# Patient Record
Sex: Female | Born: 1990 | Race: Black or African American | Hispanic: No | Marital: Single | Smoking: Current every day smoker
Health system: Southern US, Community
[De-identification: ages and names within clinical notes are randomized; demographics above are authoritative.]

## PROBLEM LIST (undated history)

## (undated) DIAGNOSIS — F319 Bipolar disorder, unspecified: Secondary | ICD-10-CM

---

## 2010-04-02 ENCOUNTER — Ambulatory Visit: Payer: Self-pay

## 2012-05-30 ENCOUNTER — Emergency Department: Payer: Self-pay | Admitting: Emergency Medicine

## 2012-05-30 LAB — URINALYSIS, COMPLETE
Bilirubin,UR: NEGATIVE
Blood: NEGATIVE
Hyaline Cast: 2
Nitrite: NEGATIVE
RBC,UR: 2 /HPF (ref 0–5)
Specific Gravity: 1.014 (ref 1.003–1.030)
Squamous Epithelial: 63

## 2012-05-30 LAB — PREGNANCY, URINE: Pregnancy Test, Urine: NEGATIVE m[IU]/mL

## 2014-05-29 ENCOUNTER — Observation Stay: Payer: Self-pay

## 2014-08-07 ENCOUNTER — Inpatient Hospital Stay: Payer: Self-pay

## 2014-08-07 LAB — CBC WITH DIFFERENTIAL/PLATELET
BASOS ABS: 0 10*3/uL (ref 0.0–0.1)
Basophil %: 0.3 %
EOS PCT: 0.2 %
Eosinophil #: 0 10*3/uL (ref 0.0–0.7)
HCT: 37.5 % (ref 35.0–47.0)
HGB: 12.2 g/dL (ref 12.0–16.0)
LYMPHS ABS: 1.7 10*3/uL (ref 1.0–3.6)
LYMPHS PCT: 13.4 %
MCH: 30 pg (ref 26.0–34.0)
MCHC: 32.6 g/dL (ref 32.0–36.0)
MCV: 92 fL (ref 80–100)
MONO ABS: 0.6 x10 3/mm (ref 0.2–0.9)
Monocyte %: 5.1 %
NEUTROS ABS: 10.2 10*3/uL — AB (ref 1.4–6.5)
Neutrophil %: 81 %
Platelet: 237 10*3/uL (ref 150–440)
RBC: 4.08 10*6/uL (ref 3.80–5.20)
RDW: 13.9 % (ref 11.5–14.5)
WBC: 12.7 10*3/uL — ABNORMAL HIGH (ref 3.6–11.0)

## 2014-08-07 LAB — GC/CHLAMYDIA PROBE AMP

## 2014-08-08 LAB — HEMATOCRIT: HCT: 36.5 % (ref 35.0–47.0)

## 2014-08-12 LAB — PATHOLOGY REPORT

## 2014-11-17 ENCOUNTER — Emergency Department: Payer: Self-pay | Admitting: Emergency Medicine
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
## 2015-05-03 NOTE — H&P (Signed)
L&D Evaluation:  History Expanded:  HPI 24 yo G1, EGA [redacted] weeks presents with c/o contractions and large gush of fluid. Cervix changed from 3-5 cm. [facility] + Trich with neg TOC 6/8, h/o Marijuana use with neg UDS in March. TDAP given 6/22   Blood Type (Maternal) A positive   Group B Strep Results Maternal (Result >5wks must be treated as unknown) negative   Maternal HIV Negative   Maternal Syphilis Ab Nonreactive   Maternal Varicella Immune   Rubella Results (Maternal) immune   Maternal T-Dap Immune   Presents with contractions, leaking fluid   Patient's Medical History Bipolar   Patient's Surgical History T&A, orthopedic surgery   Medications Pre Natal Vitamins   Allergies NKDA   Social History tobacco  drugs  former marijuana   Exam:  Vital Signs stable   General no apparent distress   Mental Status clear   Abdomen gravid, tender with contractions   Estimated Fetal Weight Average for gestational age   Pelvic no external lesions   FHT normal rate with no decels   Ucx regular   Impression:  Impression active labor   Plan:  Comments Admission for labor   Electronic Signatures: Vella KohlerBrothers, Adriyana Greenbaum K (CNM)  (Signed 16-Aug-15 08:07)  Authored: L&D Evaluation   Last Updated: 16-Aug-15 08:07 by Vella KohlerBrothers, Margarita Bobrowski K (CNM)

## 2016-04-30 ENCOUNTER — Inpatient Hospital Stay
Admission: EM | Admit: 2016-04-30 | Discharge: 2016-05-03 | DRG: 885 | Disposition: A | Discharge status: Home / Self Care | Payer: Medicaid Other | Source: Intra-hospital | Attending: Psychiatry | Admitting: Psychiatry

## 2016-04-30 ENCOUNTER — Emergency Department
Admission: EM | Admit: 2016-04-30 | Discharge: 2016-04-30 | Disposition: A | Discharge status: Home / Self Care | Payer: Medicaid Other | Attending: Emergency Medicine | Admitting: Emergency Medicine

## 2016-04-30 ENCOUNTER — Encounter: Payer: Self-pay | Admitting: Emergency Medicine

## 2016-04-30 DIAGNOSIS — R45851 Suicidal ideations: Secondary | ICD-10-CM | POA: Diagnosis present

## 2016-04-30 DIAGNOSIS — F331 Major depressive disorder, recurrent, moderate: Principal | ICD-10-CM | POA: Diagnosis present

## 2016-04-30 DIAGNOSIS — R45850 Homicidal ideations: Secondary | ICD-10-CM | POA: Diagnosis present

## 2016-04-30 DIAGNOSIS — F411 Generalized anxiety disorder: Secondary | ICD-10-CM | POA: Diagnosis present

## 2016-04-30 DIAGNOSIS — F1220 Cannabis dependence, uncomplicated: Secondary | ICD-10-CM

## 2016-04-30 DIAGNOSIS — F3163 Bipolar disorder, current episode mixed, severe, without psychotic features: Secondary | ICD-10-CM | POA: Diagnosis not present

## 2016-04-30 DIAGNOSIS — G4700 Insomnia, unspecified: Secondary | ICD-10-CM | POA: Diagnosis present

## 2016-04-30 DIAGNOSIS — F3162 Bipolar disorder, current episode mixed, moderate: Secondary | ICD-10-CM | POA: Diagnosis not present

## 2016-04-30 DIAGNOSIS — F17210 Nicotine dependence, cigarettes, uncomplicated: Secondary | ICD-10-CM | POA: Insufficient documentation

## 2016-04-30 DIAGNOSIS — F17200 Nicotine dependence, unspecified, uncomplicated: Secondary | ICD-10-CM

## 2016-04-30 HISTORY — DX: Bipolar disorder, unspecified: F31.9

## 2016-04-30 LAB — CBC WITH DIFFERENTIAL/PLATELET
Basophils Absolute: 0 10*3/uL (ref 0–0.1)
Basophils Relative: 0 %
Eosinophils Absolute: 0.1 10*3/uL (ref 0–0.7)
HCT: 42 % (ref 35.0–47.0)
HEMOGLOBIN: 14.2 g/dL (ref 12.0–16.0)
Lymphs Abs: 4.5 10*3/uL — ABNORMAL HIGH (ref 1.0–3.6)
MCH: 31.3 pg (ref 26.0–34.0)
MCHC: 33.8 g/dL (ref 32.0–36.0)
MCV: 92.8 fL (ref 80.0–100.0)
Monocytes Absolute: 0.7 10*3/uL (ref 0.2–0.9)
Neutro Abs: 7.1 10*3/uL — ABNORMAL HIGH (ref 1.4–6.5)
Neutrophils Relative %: 57 %
PLATELETS: 225 10*3/uL (ref 150–440)
RBC: 4.53 MIL/uL (ref 3.80–5.20)
RDW: 14 % (ref 11.5–14.5)
WBC: 12.4 10*3/uL — AB (ref 3.6–11.0)

## 2016-04-30 LAB — COMPREHENSIVE METABOLIC PANEL
ALBUMIN: 5 g/dL (ref 3.5–5.0)
ALK PHOS: 54 U/L (ref 38–126)
ALT: 33 U/L (ref 14–54)
AST: 24 U/L (ref 15–41)
Anion gap: 10 (ref 5–15)
BUN: 15 mg/dL (ref 6–20)
CHLORIDE: 105 mmol/L (ref 101–111)
CO2: 23 mmol/L (ref 22–32)
CREATININE: 0.82 mg/dL (ref 0.44–1.00)
Calcium: 9.6 mg/dL (ref 8.9–10.3)
GFR calc non Af Amer: >60 mL/min (ref >60)
GLUCOSE: 102 mg/dL — AB (ref 65–99)
Potassium: 4.1 mmol/L (ref 3.5–5.1)
SODIUM: 138 mmol/L (ref 135–145)
Total Bilirubin: 0.6 mg/dL (ref 0.3–1.2)
Total Protein: 8.4 g/dL — ABNORMAL HIGH (ref 6.5–8.1)

## 2016-04-30 LAB — URINE DRUG SCREEN, QUALITATIVE (ARMC ONLY)
Amphetamines, Ur Screen: NOT DETECTED
Barbiturates, Ur Screen: NOT DETECTED
Benzodiazepine, Ur Scrn: NOT DETECTED
CANNABINOID 50 NG, UR ~~LOC~~: POSITIVE — AB
COCAINE METABOLITE, UR ~~LOC~~: NOT DETECTED
MDMA (ECSTASY) UR SCREEN: NOT DETECTED
Methadone Scn, Ur: NOT DETECTED
Opiate, Ur Screen: NOT DETECTED
PHENCYCLIDINE (PCP) UR S: NOT DETECTED
Tricyclic, Ur Screen: NOT DETECTED

## 2016-04-30 LAB — ETHANOL: Alcohol, Ethyl (B): <5 mg/dL (ref <5)

## 2016-04-30 MED ORDER — HALOPERIDOL 5 MG PO TABS
5.0000 mg | ORAL_TABLET | Freq: Once | ORAL | Status: AC
  Administered 2016-04-30: 5 mg via ORAL

## 2016-04-30 MED ORDER — HALOPERIDOL 5 MG PO TABS
ORAL_TABLET | ORAL | Status: AC
  Administered 2016-04-30: 5 mg via ORAL
  Filled 2016-04-30: qty 1

## 2016-04-30 MED ORDER — HALOPERIDOL LACTATE 5 MG/ML IJ SOLN: 5.0000 mg | Freq: Once | INTRAMUSCULAR | Status: DC

## 2016-04-30 MED ORDER — LORAZEPAM 2 MG PO TABS
2.0000 mg | ORAL_TABLET | Freq: Once | ORAL | Status: AC
  Administered 2016-04-30: 2 mg via ORAL

## 2016-04-30 MED ORDER — LORAZEPAM 2 MG PO TABS: 2.0000 mg | ORAL_TABLET | ORAL | Status: DC | PRN

## 2016-04-30 MED ORDER — NICOTINE 21 MG/24HR TD PT24
21.0000 mg | MEDICATED_PATCH | Freq: Once | TRANSDERMAL | Status: DC
  Administered 2016-04-30: 21 mg via TRANSDERMAL
  Filled 2016-04-30: qty 1

## 2016-04-30 MED ORDER — LORAZEPAM 1 MG PO TABS
1.0000 mg | ORAL_TABLET | Freq: Once | ORAL | Status: AC
  Administered 2016-04-30: 1 mg via ORAL
  Filled 2016-04-30: qty 1

## 2016-04-30 MED ORDER — LORAZEPAM 2 MG PO TABS
ORAL_TABLET | ORAL | Status: AC
  Administered 2016-04-30: 2 mg via ORAL
  Filled 2016-04-30: qty 1

## 2016-04-30 MED ORDER — DIPHENHYDRAMINE HCL 50 MG/ML IJ SOLN: 50.0000 mg | Freq: Once | INTRAMUSCULAR | Status: DC

## 2016-04-30 MED ORDER — DIPHENHYDRAMINE HCL 25 MG PO CAPS
ORAL_CAPSULE | ORAL | Status: AC
  Administered 2016-04-30: 50 mg via ORAL
  Filled 2016-04-30: qty 2

## 2016-04-30 MED ORDER — LORAZEPAM 2 MG/ML IJ SOLN: 2.0000 mg | Freq: Once | INTRAMUSCULAR | Status: DC

## 2016-04-30 MED ORDER — DIPHENHYDRAMINE HCL 25 MG PO CAPS
50.0000 mg | ORAL_CAPSULE | Freq: Once | ORAL | Status: AC
  Administered 2016-04-30: 50 mg via ORAL

## 2016-04-30 NOTE — ED Notes (Signed)

## 2016-04-30 NOTE — ED Notes (Signed)
She remains in hallway bed - pt is anxious and stating  "this is bullshit - I am just gonna walk off and leave."  Pt educated about IVC and what that means   [facility]

## 2016-04-30 NOTE — ED Notes (Signed)
IVC/Consult pending/ legal papers on chart

## 2016-04-30 NOTE — ED Notes (Signed)
BEHAVIORAL HEALTH ROUNDING Patient sleeping: No. Patient alert and oriented: yes Behavior appropriate: Yes.  ; If no, describe:  Nutrition and fluids offered: yes Toileting and hygiene offered: Yes  Sitter present: q15 minute observations and security  monitoring Law enforcement present: Yes  ODS  

## 2016-04-30 NOTE — ED Provider Notes (Signed)
[facility] Emergency Department Provider Note  ____________________________________________  Time seen: 4:30 PM  I have reviewed the triage vital signs and the nursing notes.   HISTORY  Chief Complaint Medical Clearance    HPI [name] is a 25 y.o. female brought to the ED under involuntary commitment petition due to stating 2R a crisis counselor that she was feeling suicidal and also had homicidal thoughts toward her child. She has a history of bipolar disorder and is been off medications for the past 2 months because she didn't like the way it made her feel. On reviewing electronic medical record I see that she's been followed up at [facility] family medicine although she did complain of a psychiatry referral in January she missed a follow-up appointment in February. They voiced significant concerns about her psychiatric stability at that time but just gave her counseling and encouraged her to continue following up.  Clinic note from 02-24-16 [facility] Med  Anxiety Sertraline clearly not effective in managing symptoms however patient did not follow-up 4 weeks post initiation of medication therefore I was not aware of treatment failure. There are many other underlying mental health issues present. I suspect her anxiety is secondary to untreated bipolar disorder, substance use. Will begin Lexapro however she would benefit greatly from seeing psychiatry on an ongoing basis. - escitalopram oxalate (LEXAPRO) 10 MG tablet; Take 1 tablet (10 mg total) by mouth daily.  On Depo-Provera for contraception - medroxyPROGESTERone (DEPO-PROVERA) injection 150 mg; Inject 1 mL (150 mg total) into the muscle Every three (3) months.  History of bipolar disorder In the course of this visit she displays mood swings, impulsivity, psychomotor and poor coping skills. Advised her that I do not have the expertise to fully manage her mental health disorders. I strongly encouraged her to call  and reschedule with psychiatry. I am extremely concerned about her ability to parent and her impaired cognitive thought process. She was given numbers for [employer] [facility] and [employer]. While I do not feel she is an immediate threat to herself or others I do feel she is unstable and not receiving adequate treatment. Psychiatry expressed possible personality disorder and I am concerned the paranoia. She will need to get back in with psychiatry. I will monitor her closely and see her back in 4 weeks. At her return visit we will reassess where she is with resuming care with psychiatry. If she does not follow through I will look into services through the local health department for family home visiting services for parenting information and assistance.   Barriers to goals identified and addressed. Pertinent handouts were given today and reviewed with the patient as indicated. The Care Plan and Self-Management goals have been included on the AVS and the AVS has been printed. I encouraged the patient to keep regular logs for me to review at their next visit. Any outside resources or referrals needed at this time are noted above. Patient's current medications have been reviewed. Patient voiced understanding and all questions have been answered to satisfaction.       Past Medical History  Diagnosis Date  . Bipolar 1 disorder (HCC)      There are no active problems to display for this patient.    History reviewed. No pertinent past surgical history. None  No current outpatient prescriptions on file. None  Allergies Review of patient's allergies indicates no known allergies.   No family history on file.  Social History Social History  Substance Use Topics  . Smoking status: Current Every Day Smoker -- .3 years    Types: Cigarettes  . Smokeless tobacco: None  . Alcohol Use: Yes    Review of Systems  Constitutional:   No fever  or chills.  Eyes:   No vision changes.  ENT:   No sore throat. No rhinorrhea. Cardiovascular:   No chest pain. Respiratory:   No dyspnea or cough. Gastrointestinal:   Negative for abdominal pain, vomiting and diarrhea.  Genitourinary:   Negative for dysuria or difficulty urinating. Musculoskeletal:   Negative for focal pain or swelling Neurological:   Negative for headaches 10-point ROS otherwise negative.  ____________________________________________   PHYSICAL EXAM:  VITAL SIGNS: ED Triage Vitals  Enc Vitals Group     BP 04/30/16 1522 150/95 mmHg     Pulse Rate 04/30/16 1522 103     Resp 04/30/16 1522 20     Temp 04/30/16 1522 98.7 F (37.1 C)     Temp Source 04/30/16 1522 Oral     SpO2 04/30/16 1522 100 %     Weight 04/30/16 1522 154 lb (69.854 kg)     Height 04/30/16 1522 5\' 6"  (1.676 m)     Head Cir --      Peak Flow --      Pain Score --      Pain Loc --      Pain Edu? --      Excl. in GC? --     Vital signs reviewed, nursing assessments reviewed.   Constitutional:   Alert and oriented. Well appearing and in no distress.Upset and tearful Eyes:   No scleral icterus. No conjunctival pallor. PERRL. EOMI.  No nystagmus. ENT   Head:   Normocephalic and atraumatic.   Nose:   No congestion/rhinnorhea. No septal hematoma   Mouth/Throat:   MMM, no pharyngeal erythema. No peritonsillar mass.    Neck:   No stridor. No SubQ emphysema. No meningismus. Hematological/Lymphatic/Immunilogical:   No cervical lymphadenopathy. Cardiovascular:   RRR. Symmetric bilateral radial and DP pulses.  No murmurs.  Respiratory:   Normal respiratory effort without tachypnea nor retractions. Breath sounds are clear and equal bilaterally. No wheezes/rales/rhonchi. Gastrointestinal:   Soft and nontender. Non distended. There is no CVA tenderness.  No rebound, rigidity, or guarding. Genitourinary:   deferred Musculoskeletal:   Nontender with normal range of motion in all extremities.  No joint effusions.  No lower extremity tenderness.  No edema. Neurologic:   Normal speech and language.  CN 2-10 normal. Motor grossly intact. No gross focal neurologic deficits are appreciated.  Skin:    Skin is warm, dry and intact. No rash noted.  No petechiae, purpura, or bullae. No apparent self injury  ____________________________________________    LABS (pertinent positives/negatives) (all labs ordered are listed, but only abnormal results are displayed) Labs Reviewed  COMPREHENSIVE METABOLIC PANEL - Abnormal; Notable for the following:    Glucose, Bld 102 (*)    Total Protein 8.4 (*)    All other components within normal limits  CBC WITH DIFFERENTIAL/PLATELET - Abnormal; Notable for the following:    WBC 12.4 (*)    Neutro Abs 7.1 (*)    Lymphs Abs 4.5 (*)    All other components within normal limits  URINE DRUG SCREEN, QUALITATIVE ([facility] ONLY) - Abnormal; Notable for the following:    Cannabinoid 50 Ng, Ur [place] POSITIVE (*)    All other components within normal limits  ETHANOL  ____________________________________________   EKG    ____________________________________________    RADIOLOGY    ____________________________________________   PROCEDURES   ____________________________________________   INITIAL IMPRESSION / ASSESSMENT AND PLAN / ED COURSE  Pertinent labs & imaging results that were available during my care of the patient were reviewed by me and considered in my medical decision making (see chart for details).  Patient well-appearing no acute distress. Mildly tachycardic at triage which I think is due to her anxiety of being emotionally upset. On my exam her heart rate is about 80. She is medically stable. She is under involuntary commitment petition from [facility]. On reviewing their notes, she does have an extensive complicated history with bipolar disorder and has had severe symptoms recently. In the electronic medical record I also see that she is  followed up with [facility] family medicine and has had issues with compliance with medication and follow-up with psychiatry despite frequently being counseled and encouraged to go. At this time she seems high risk for impulsive or dangerous behavior and we will maintain the IVC hold pending psychiatry evaluation in the emergency department.     ____________________________________________   FINAL CLINICAL IMPRESSION(S) / ED DIAGNOSES  Final diagnoses:  Suicidal ideation  Bipolar disorder, current episode mixed, moderate (HCC)       Portions of this note were generated with dragon dictation software. Dictation errors may occur despite best attempts at proofreading.   Sharman Cheek, MD 04/30/16 [phone]

## 2016-04-30 NOTE — ED Notes (Signed)
IVC/Consult pending/ legal papers on chart/Moved to [facility]

## 2016-04-30 NOTE — ED Notes (Signed)
Arrives IVC due to SI statement to crisis counselor. Denies intention of harming self. States has not taken anything. States does not have history of self harm.

## 2016-04-30 NOTE — ED Notes (Signed)

## 2016-04-30 NOTE — BH Assessment (Signed)
Assessment Note  [name] is an 25 y.o. female. Ms. Luster arrived to the ED with [facility] polices under IVC. She reports that she went to [facility] in hopes of getting help with her bipolar and disability.  She says that she does not know what happened.  She states that the Psychiatrist from [facility] called the police. She states the police told her the psychiatrist found reason for her to be committed. She reports that she is feeling anxious currently and has been dealing with it for the past few months, but this time it did not seem to go away. She reports that her chest gets tight and her body goes stiff.  She reports that she is currently depressed. She denied having auditory or visual hallucinations.  She denied suicidal or homicidal ideation or intent. She reports that she has been under financial stress, and is currently unemployed.  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  She reports that she has court for unlawful use of a motor vehicle, that is currently under appeal. In addition, she is dealing with a recent break up. She reports that she drinks 2 - 24 oz beers a day.  Diagnosis: Bipolar Disorder  Past Medical History:  Past Medical History  Diagnosis Date  . Bipolar 1 disorder (HCC)     History reviewed. No pertinent past surgical history.  Family History: No family history on file.  Social History:  reports that she has been smoking Cigarettes.  She has smoked for the past .3 years. She does not have any smokeless tobacco history on file. She reports that she drinks alcohol. Her drug history is not on file.  Additional Social History:  Alcohol / Drug Use History of alcohol / drug use?: Yes Substance #1 Name of Substance 1: Alcohol 1 - Age of First Use: 18 1 - Amount (size/oz): 2-24 oz beers 1 - Frequency: daily 1 - Last Use / Amount: 04/29/2016  CIWA: CIWA-Ar BP: 122/79 mmHg Pulse Rate: 75 COWS:    Allergies: No Known Allergies  Home Medications:  (Not in a hospital  admission)  OB/GYN Status:  Patient's last menstrual period was 04/23/2016.  General Assessment Data Location of Assessment: [facility] ED TTS Assessment: In system Is this a Tele or Face-to-Face Assessment?: Face-to-Face Is this an Initial Assessment or a Re-assessment for this encounter?: Initial Assessment Marital status: Single Maiden name: n/a Is patient pregnant?: No Pregnancy Status: No Living Arrangements: Children Can pt return to current living arrangement?: Yes Admission Status: Involuntary Is patient capable of signing voluntary admission?: Yes Referral Source: Psychiatrist Insurance type: Medicaid  Medical Screening Exam [facility] Walk-in ONLY) Medical Exam completed: Yes  Crisis Care Plan Living Arrangements: Children Legal Guardian: Other: (Self) Name of Psychiatrist: Denied -  Name of Therapist: Denied  Education Status Is patient currently in school?: No Current Grade: n/a Highest grade of school patient has completed: 12th Name of school: [employer] person: n/a  Risk to self with the past 6 months Suicidal Ideation: No Has patient been a risk to self within the past 6 months prior to admission? : No Suicidal Intent: No Has patient had any suicidal intent within the past 6 months prior to admission? : No Is patient at risk for suicide?: No Suicidal Plan?: No Has patient had any suicidal plan within the past 6 months prior to admission? : No Access to Means: No What has been your use of drugs/alcohol within the last 12 months?: drinks alcohol daily Previous Attempts/Gestures:  No How many times?: 0 Other Self Harm Risks: denied Triggers for Past Attempts: None known Intentional Self Injurious Behavior: None Family Suicide History: No Recent stressful life event(s): Legal Issues, Financial Problems (Not working. relationship issues) Persecutory voices/beliefs?: No Depression: Yes Depression Symptoms: Tearfulness Substance abuse history and/or treatment  for substance abuse?: No Suicide prevention information given to non-admitted patients: Not applicable  Risk to Others within the past 6 months Homicidal Ideation: No Does patient have any lifetime risk of violence toward others beyond the six months prior to admission? : No Thoughts of Harm to Others: No Current Homicidal Intent: No Current Homicidal Plan: No Access to Homicidal Means: No Identified Victim: None identified History of harm to others?: No Assessment of Violence: None Noted Violent Behavior Description: None in recent past Does patient have access to weapons?: No Criminal Charges Pending?: No Does patient have a court date: No Is patient on probation?: No  Psychosis Hallucinations: None noted Delusions: None noted  Mental Status Report Appearance/Hygiene: In scrubs Eye Contact: Fair Motor Activity: Unremarkable Speech: Slow, Soft Level of Consciousness: Quiet/awake Mood: Depressed Affect: Flat Anxiety Level: Minimal Thought Processes: Coherent Judgement: Unimpaired Orientation: Person, Place, Time, Situation Obsessive Compulsive Thoughts/Behaviors: None  Cognitive Functioning Concentration: Normal Memory: Recent Intact IQ: Average Insight: Fair Impulse Control: Fair Appetite: Poor Sleep: No Change Vegetative Symptoms: Staying in bed  ADLScreening [facility] Assessment Services) Patient's cognitive ability adequate to safely complete daily activities?: Yes Patient able to express need for assistance with ADLs?: Yes Independently performs ADLs?: Yes (appropriate for developmental age)  Prior Inpatient Therapy Prior Inpatient Therapy: No Prior Therapy Dates: n/a Prior Therapy Facilty/Provider(s): n/a Reason for Treatment: n/a  Prior Outpatient Therapy Prior Outpatient Therapy: Yes Prior Therapy Dates: 2016 Prior Therapy Facilty/Provider(s): [facility] Reason for Treatment: Bipolar Disorder Does patient have an ACCT team?: No Does  patient have Intensive In-House Services?  : No Does patient have [facility] services? : No Does patient have P4CC services?: No  ADL Screening (condition at time of admission) Patient's cognitive ability adequate to safely complete daily activities?: Yes Patient able to express need for assistance with ADLs?: Yes Independently performs ADLs?: Yes (appropriate for developmental age)       Abuse/Neglect Assessment (Assessment to be complete while patient is alone) Physical Abuse: Denies Verbal Abuse: Denies Sexual Abuse: Denies Exploitation of patient/patient's resources: Denies Self-Neglect: Denies Values / Beliefs Cultural Requests During Hospitalization: None   Advance Directives (For Healthcare) Does patient have an advance directive?: No Would patient like information on creating an advanced directive?: No - patient declined information    Additional Information 1:1 In Past 12 Months?: No CIRT Risk: No Elopement Risk: No Does patient have medical clearance?: Yes     Disposition:  Disposition Initial Assessment Completed for this Encounter: Yes Disposition of Patient: Inpatient treatment program Type of inpatient treatment program: Adult  On Site Evaluation by:   Reviewed with Physician:    Justice DeedsKeisha Soua Lenk 04/30/2016 8:46 PM
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
## 2016-05-01 DIAGNOSIS — F331 Major depressive disorder, recurrent, moderate: Secondary | ICD-10-CM

## 2016-05-01 DIAGNOSIS — F17200 Nicotine dependence, unspecified, uncomplicated: Secondary | ICD-10-CM

## 2016-05-01 DIAGNOSIS — F1220 Cannabis dependence, uncomplicated: Secondary | ICD-10-CM

## 2016-05-01 LAB — URINALYSIS COMPLETE WITH MICROSCOPIC (ARMC ONLY)
Bilirubin Urine: NEGATIVE
Glucose, UA: NEGATIVE mg/dL
Hgb urine dipstick: NEGATIVE
KETONES UR: NEGATIVE mg/dL
Leukocytes, UA: NEGATIVE
Nitrite: NEGATIVE
PH: 6 (ref 5.0–8.0)
PROTEIN: NEGATIVE mg/dL
Specific Gravity, Urine: 1.018 (ref 1.005–1.030)

## 2016-05-01 LAB — TSH: TSH: 2.14 u[IU]/mL (ref 0.350–4.500)

## 2016-05-01 LAB — HEMOGLOBIN A1C: Hgb A1c MFr Bld: 5.5 % (ref 4.0–6.0)

## 2016-05-01 LAB — LIPID PANEL
CHOL/HDL RATIO: 2.8 ratio
CHOLESTEROL: 184 mg/dL (ref 0–200)
HDL: 65 mg/dL (ref >40)
LDL Cholesterol: 109 mg/dL — ABNORMAL HIGH (ref 0–99)
TRIGLYCERIDES: 51 mg/dL (ref <150)
VLDL: 10 mg/dL (ref 0–40)

## 2016-05-01 LAB — PREGNANCY, URINE: PREG TEST UR: NEGATIVE

## 2016-05-01 MED ORDER — WHITE PETROLATUM GEL
Status: DC | PRN
  Filled 2016-05-01: qty 5

## 2016-05-01 MED ORDER — MAGNESIUM HYDROXIDE 400 MG/5ML PO SUSP: 30.0000 mL | Freq: Every day | ORAL | Status: DC | PRN

## 2016-05-01 MED ORDER — ACETAMINOPHEN 325 MG PO TABS: 650.0000 mg | ORAL_TABLET | Freq: Four times a day (QID) | ORAL | Status: DC | PRN

## 2016-05-01 MED ORDER — FLUOXETINE HCL 10 MG PO CAPS
10.0000 mg | ORAL_CAPSULE | Freq: Every day | ORAL | Status: DC
  Administered 2016-05-01 – 2016-05-03 (×3): 10 mg via ORAL
  Filled 2016-05-01 (×3): qty 1

## 2016-05-01 MED ORDER — ALUM & MAG HYDROXIDE-SIMETH 200-200-20 MG/5ML PO SUSP: 30.0000 mL | ORAL | Status: DC | PRN

## 2016-05-01 MED ORDER — HYDROXYZINE HCL 50 MG PO TABS
50.0000 mg | ORAL_TABLET | Freq: Three times a day (TID) | ORAL | Status: DC | PRN
  Administered 2016-05-01 (×2): 50 mg via ORAL
  Filled 2016-05-01 (×2): qty 1

## 2016-05-01 MED ORDER — NICOTINE 21 MG/24HR TD PT24
21.0000 mg | MEDICATED_PATCH | Freq: Every day | TRANSDERMAL | Status: DC
  Administered 2016-05-01 – 2016-05-03 (×3): 21 mg via TRANSDERMAL
  Filled 2016-05-01 (×3): qty 1

## 2016-05-01 MED ORDER — HALOPERIDOL 0.5 MG PO TABS
0.5000 mg | ORAL_TABLET | Freq: Three times a day (TID) | ORAL | Status: DC
  Administered 2016-05-01 – 2016-05-03 (×7): 0.5 mg via ORAL
  Filled 2016-05-01 (×8): qty 1

## 2016-05-01 NOTE — Tx Team (Signed)
Initial Interdisciplinary Treatment Plan   PATIENT STRESSORS: Financial difficulties transportation issues    PATIENT STRENGTHS: General fund of knowledge Physical Health   PROBLEM LIST: Problem List/Patient Goals Date to be addressed Date deferred Reason deferred Estimated date of resolution  anxiety 05/01/16     Depression  05/01/16     'decrease in anxiety' 05/01/16                                          DISCHARGE CRITERIA:  Improved stabilization in mood, thinking, and/or behavior  PRELIMINARY DISCHARGE PLAN: Outpatient therapy  PATIENT/FAMIILY INVOLVEMENT: This treatment plan has been presented to and reviewed with the patient, [name], and/or family member.  The patient and family have been given the opportunity to ask questions and make suggestions.  Hillery JacksChristine W London [place] 05/01/2016, 12:11 AM

## 2016-05-01 NOTE — Progress Notes (Signed)
D: Patient is a 25 y.o.  year-old female admitted to [facility] ambulatory without difficulty. Patient is alert and oriented upon admission. Skin search complete with Baird Lyonsasey RN and no contraband found  A: Admission assessment completed without difficulty. Skin and contraband assessment completed with no skin abnormalities nor contraband found. Q.15 minute safety checks were implemented at the time of admission. Patient was oriented to the unit and escorted to room   20                                                                 R: Patient was receptive to and cooperative with admission assessment. Patient contracts for safety on the unit at this time
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
## 2016-05-01 NOTE — Plan of Care (Signed)
Problem: Diagnosis: Increased Risk For Suicide Attempt Goal: LTG-Patient Will Report Improved Mood and Deny Suicidal LTG (by discharge) Patient will report improved mood and deny suicidal ideation.  Outcome: Progressing Patient is brighter on the unit and denies SI.

## 2016-05-01 NOTE — Progress Notes (Signed)
Recreation Therapy Notes  Date: 05.09.17 Time: 3:00 pm Location: Craft Room  Group Topic: Self-expression  Goal Area(s) Addresses:  Patient will be able to identify a color that represents each emotion. Patient will verbalize benefit of using art as a means of self-expression. Patient will verbalize one emotion experienced while participating in activity.  Behavioral Response: Arrived late, Attentive, Interactive  Intervention: The Colors Within Me  Activity: Patients were given a blank face worksheet and instructed to pick a color for each emotion they were experiencing and to show on the face how much of that emotion they were feeling.  Education: LRT educated patients on other forms of self-expression.  Education Outcome: Acknowledges education/In group clarification offered   Clinical Observations/Feedback: Patient arrived to group at approximately 3:26 pm. LRT explained activity. Patient completed activity by picking a color for each emotion she was experiencing and showing how much of that emotion she was experiencing. Patient contributed to group discussion by sharing what emotions she was feeling, that her emotions are dynamic, how she sees her emotions changing when she is able to d/c, that it was helpful to see her emotions on paper and why, and what emotions she felt while she participated in activity.  Jacquelynn CreeGreene,Amerie Beaumont M, LRT/CTRS 05/01/2016 4:20 PM

## 2016-05-01 NOTE — [facility] Group Notes (Signed)
[facility]   05/01/2016 11:49 AM  Participation Quality:  Patient attended and participated in group discussion introducing herself and sharing that her SMART goal is to "stop getting overwhelmed and be at peace". Patient initially reported her anxiety and depression and lowered then both to 5 once facilitator explained that staff would like to see a 3 or less in their depression and anxiety rating by discharge. Patient was given a daily workbook on Recovery and ensured patient was informed of LOS, rules for milieu, roles of staff, and ensured patient was aware of their psychiatrist and social worker names.   Mood/Affect:  Anxious  Depression Rating:  5  Anxiety Rating:  5  Thoughts of Suicide:  No Will you contract for safety?   NA  Current AVH:  No  Plan for Discharge/Comments:  Discharge home with mom  Transportation Means: mom will pick up  Supports: mom is support and has a child  [name], [name], MSW, LCSW

## 2016-05-01 NOTE — Progress Notes (Signed)
D:  Pt was tearful and anxious earlier in shift, pt states that she does not need to be here and that she wants to leave, pt was upset with the doctor around 10 am because she states that she had not been seen yet, MD notified, MD went to see patient, pt calmed down, pt is demanding at times, denies SI/HI/AVH.  A:  Emotional support provided, Encouraged pt to continue with treatment plan and attend all group activities, q15 min checks maintained for safety.  R:  Pt is not very receptive--minimizing need to be here, going to some groups, med compliant and otherwise pleasant and cooperative with staff and other patients on the unit.

## 2016-05-01 NOTE — [facility] Group Notes (Signed)
[facility] Group Notes:  (Nursing/MHT/Case Management/Adjunct)  Date:  05/01/2016  Time:  2:35 PM  Type of Therapy:  Psychoeducational Skills  Participation Level:  Active  Participation Quality:  Appropriate  Affect:  Appropriate  Cognitive:  Appropriate  Insight:  Appropriate  Engagement in Group:  Engaged and Improving  Modes of Intervention:  Discussion and Education  Summary of Progress/Problems:  Mickey Farberamela M Moses Ellison 05/01/2016, 2:35 PM

## 2016-05-01 NOTE — Progress Notes (Signed)
D: Patient appears brighter on the unit. She denies SI/HI/AVH. She states she's here because of a misunderstanding at [facility]. She denies any pain. She's visible in the milieu.  A: Medication was given with education. Encouragement was provided.  R: Patient was compliant with medication. She has remained calm and cooperative. Safety maintained with 15 min checks,
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
## 2016-05-01 NOTE — [facility] Suicide Risk Assessment (Addendum)
[facility]   Nursing information obtained from:    Demographic factors:    Current Mental Status:    Loss Factors:    Historical Factors:    Risk Reduction Factors:     Total Time spent with patient: 1 hour Principal Problem: SI and depression Diagnosis:   Patient Active Problem List   Diagnosis Date Noted  . Tobacco use disorder [F17.200] 05/01/2016  . Major depressive disorder, recurrent episode, moderate (HCC) [F33.1] 05/01/2016  . Cannabis use disorder, severe, dependence (HCC) [F12.20] 05/01/2016   Subjective Data:   Continued Clinical Symptoms:  Alcohol Use Disorder Identification Test Final Score (AUDIT): 3 The "Alcohol Use Disorders Identification Test", Guidelines for Use in Primary Care, Second Edition.  World Science writerHealth Organization [facility](WHO). Score between 0-7:  no or low risk or alcohol related problems. Score between 8-15:  moderate risk of alcohol related problems. Score between 16-19:  high risk of alcohol related problems. Score 20 or above:  warrants further diagnostic evaluation for alcohol dependence and treatment.   CLINICAL FACTORS:   Depression:   Severe Postpartum Depression Previous Psychiatric Diagnoses and Treatments     Psychiatric Specialty Exam: ROS  Blood pressure 111/64, pulse 78, temperature 98 F (36.7 C), temperature source Oral, resp. rate 20, height 5\' 6"  (1.676 m), weight 67.132 kg (148 lb), last menstrual period 04/23/2016, SpO2 100 %.Body mass index is 23.9 kg/(m^2).                                                        COGNITIVE FEATURES THAT CONTRIBUTE TO RISK:  None    SUICIDE RISK:   Moderate:  Frequent suicidal ideation with limited intensity, and duration, some specificity in terms of plans, no associated intent, good self-control, limited dysphoria/symptomatology, some risk factors present, and identifiable protective factors, including available and accessible social  support.  [facility]  I certify that inpatient services furnished can reasonably be expected to improve the patient's condition.   Jimmy FootmanHernandez-Gonzalez,  Coury Grieger, MD 05/01/2016, 2:57 PM

## 2016-05-02 LAB — RAPID HIV SCREEN (HIV 1/2 AB+AG)
HIV 1/2 Antibodies: NONREACTIVE
HIV-1 P24 Antigen - HIV24: NONREACTIVE

## 2016-05-02 LAB — PROLACTIN: Prolactin: 47.3 ng/mL — ABNORMAL HIGH (ref 4.8–23.3)

## 2016-05-02 LAB — CHLAMYDIA/NGC RT PCR (ARMC ONLY)
Chlamydia Tr: NOT DETECTED
N gonorrhoeae: NOT DETECTED

## 2016-05-02 MED ORDER — HALOPERIDOL 0.5 MG PO TABS: 0.5000 mg | ORAL_TABLET | Freq: Three times a day (TID) | ORAL | Status: DC | PRN

## 2016-05-02 MED ORDER — FLUOXETINE HCL 10 MG PO CAPS: 10.0000 mg | ORAL_CAPSULE | Freq: Every day | ORAL | Status: DC

## 2016-05-02 MED ORDER — HYDROXYZINE HCL 50 MG PO TABS
50.0000 mg | ORAL_TABLET | Freq: Every day | ORAL | Status: DC
  Administered 2016-05-02: 50 mg via ORAL
  Filled 2016-05-02: qty 1

## 2016-05-02 NOTE — [facility] Group Notes (Signed)
[facility] LCSW Group Therapy  05/02/2016 2:36 PM  Type of Therapy:  Group Therapy   Summary of Progress/Problems:  Patient attended and participated in group and was attentive during the movie shown "The Happy Movie" which is a documentary about what truly makes people happy and how to measure happiness. Patient was attentive throughout the group session and introduced herself sharing that from the movie she realized that "50% is genetics, 10% is your job or situation when it comes to happiness so I have to take control of the other 40%".   Lulu RidingIngle, Olene Godfrey T, MSW, LCSW 05/02/2016, 2:36 PM

## 2016-05-02 NOTE — Progress Notes (Signed)

## 2016-05-02 NOTE — Plan of Care (Signed)
Problem: Diagnosis: Increased Risk For Suicide Attempt Goal: STG-Patient Will Comply With Medication Regime Outcome: Progressing Patient is compliant with medication regimen currently
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
## 2016-05-02 NOTE — [facility] Group Notes (Signed)
[facility] LCSW Group Therapy  05/02/2016 11:03 AM  Type of Therapy:  Group Therapy  Participation Level:  Active  Participation Quality:  Appropriate and Attentive  Affect:  Appropriate  Cognitive:  Alert, Appropriate and Oriented  Insight:  Engaged  Engagement in Therapy:  Engaged  Modes of Intervention:  Discussion, Socialization and Support  Summary of Progress/Problems: Patient attended and participated in group discussion appropriately introducing herself and sharing during an introductory exercise that her Dream Vacation would be to [place] the [place] and the culture". Patient shared that her life is out of Balance with conflict with family but she wants to get a better job and provide for herself and child. Patient received support from other group members encouraging her to go to school and giving hope that she will find a "GOOD husband".    Lulu RidingIngle, Clayton Jarmon T, MSW, LCSW 05/02/2016, 11:03 AM

## 2016-05-02 NOTE — Tx Team (Signed)
Interdisciplinary Treatment Plan Update (Adult)  Date:  05/02/2016 Time Reviewed:  5:50 PM  Progress in Treatment: Attending groups: Yes. Participating in groups:  Yes. Taking medication as prescribed:  Yes. Tolerating medication:  Yes. Family/Significant othe contact made:  No, will contact:  mother Patient understands diagnosis:  Yes. Discussing patient identified problems/goals with staff:  Yes. Medical problems stabilized or resolved:  Yes. Denies suicidal/homicidal ideation: Yes. Issues/concerns per patient self-inventory:  No. Other:  New problem(s) identified: No, Describe:  'Pt concerns about bills  Discharge Plan or Barriers:  D/C home with Follow up with [facility] CST  Reason for Continuation of Hospitalization: Anxiety Depression  Comments:[name] is a 25 year old female with a history of bipolar depression and complaints of irritability and anxiety. Since beginning her medications (haldol 0.5 mg and Prozac 10 mg) yesterday the patient reports feeling more calm and at ease and is more receptive to the admission. She does report that some other patients in group therapy were causing her to become more anxious and irritable this morning, but otherwise she feels well and has found benefit in the split group therapies. She is interested in one on one counseling. She states that she feels motivated to take care of herself and to eat. She stated she had difficulty staying asleep last night, and found it frustrating. She denies any suicidal or homicidal ideations, or any A/VH. She is still anxious about having to pay her bills that are due at the end of this week as she is unsure of where she will get the money  Estimated length of stay:1 day  New goal(s):  Review of initial/current patient goals per problem list:   1.  Goal(s):Pt will participate in aftercare plan.  Met:  No  Target date:Dsicharge  As evidenced ZO:XWRU for housing and psychiatric follow up being  identified  2.  Goal (s):Pt will exhibit decreased depressive symptoms and suicidal ideation.  Met:  No  Target date:Discharge  As evidenced by:Pt will utilize self-rating of depression at 3 or below and demonstrate decreased signs of depression OR be deemed stable by MD.    Attendees: Patient:  [name] 5/10/20175:50 PM  Family:   5/10/20175:50 PM  Physician:  Hernandez 5/10/20175:50 PM  Nursing:   Polly Cobia, RN 5/10/20175:50 PM  Case Manager:   5/10/20175:50 PM  Counselor:  Dossie Arbour, LCSW 5/10/20175:50 PM  Other:   5/10/20175:50 PM  Other:   5/10/20175:50 PM  Other:   5/10/20175:50 PM  Other:  5/10/20175:50 PM  Other:  5/10/20175:50 PM  Other:  5/10/20175:50 PM  Other:  5/10/20175:50 PM  Other:  5/10/20175:50 PM  Other:  5/10/20175:50 PM  Other:   5/10/20175:50 PM   Scribe for Treatment Team:   August Saucer, 05/02/2016, 5:50 PM, MSW, LCSW

## 2016-05-02 NOTE — Plan of Care (Signed)
Problem: Diagnosis: Increased Risk For Suicide Attempt Goal: LTG-Patient Will Report Improved Mood and Deny Suicidal LTG (by discharge) Patient will report improved mood and deny suicidal ideation.  Outcome: Progressing Patient reports her mood is better today than it was yesterday and patient denies suicidal ideation currently.

## 2016-05-02 NOTE — [facility] Group Notes (Signed)
[facility] Group Notes:  (Nursing/MHT/Case Management/Adjunct)  Date:  05/02/2016  Time:  3:53 PM  Type of Therapy:  Psychoeducational Skills  Participation Level:  Active  Participation Quality:  Appropriate, Attentive, Sharing and Supportive  Affect:  Appropriate  Cognitive:  Appropriate  Insight:  Appropriate  Engagement in Group:  Supportive  Modes of Intervention:  Discussion and Education  Summary of Progress/Problems:  [name] [name] 05/02/2016, 3:53 PM

## 2016-05-02 NOTE — BHH Suicide Risk Assessment (Signed)
[facility]   Principal Problem: Major depressive disorder, recurrent episode, moderate (HCC) Discharge Diagnoses:  Patient Active Problem List   Diagnosis Date Noted  . Tobacco use disorder [F17.200] 05/01/2016  . Major depressive disorder, recurrent episode, moderate (HCC) [F33.1] 05/01/2016  . Cannabis use disorder, severe, dependence (HCC) [F12.20] 05/01/2016     Psychiatric Specialty Exam: ROS  Blood pressure 118/74, pulse 75, temperature 98.5 F (36.9 C), temperature source Oral, resp. rate 20, height 5\' 6"  (1.676 m), weight 67.132 kg (148 lb), last menstrual period 04/23/2016, SpO2 100 %.Body mass index is 23.9 kg/(m^2).                                                       Mental Status Per Nursing Assessment::   On Admission:     Demographic Factors:  Adolescent or young adult, Low socioeconomic status, Living alone and Unemployed  Loss Factors: Financial problems/change in socioeconomic status  Historical Factors: Impulsivity  Risk Reduction Factors:   Responsible for children under 25 years of age, Sense of responsibility to family and Positive social support  Continued Clinical Symptoms:  Depression:   Impulsivity Insomnia Alcohol/Substance Abuse/Dependencies Previous Psychiatric Diagnoses and Treatments  Cognitive Features That Contribute To Risk:  None    Suicide Risk:  Minimal: No identifiable suicidal ideation.  Patients presenting with no risk factors but with morbid ruminations; may be classified as minimal risk based on the severity of the depressive symptoms   Jimmy FootmanHernandez-Gonzalez,  Aryn Kops, MD 05/03/2016, 8:07 AM

## 2016-05-02 NOTE — Progress Notes (Addendum)
[facility]  05/03/2016 1:24 PM [name]  MRN:  [id number] Subjective:  [name] is a 25 year old female with a history of bipolar depression and complaints of irritability and anxiety. Since beginning her medications (haldol 0.5 mg and Prozac 10 mg) yesterday the patient reports feeling more calm and at ease and is more receptive to the admission. She does report that some other patients in group therapy were causing her to become more anxious and irritable this morning, but otherwise she feels well and has found benefit in the split group therapies. She is interested in one on one counseling. She states that she feels motivated to take care of herself and to eat. She stated she had difficulty staying asleep last night, and found it frustrating. She denies any suicidal or homicidal ideations, or any A/VH. She is still anxious about having to pay her bills that are due at the end of this week as she is unsure of where she will get the money.  In treatment team patient discussed her worries and frustrations, and was told about CST. The patient seemed interested in this as a resource once she is discharged and will discuss with SW later  Per Nursing:  D: Patient is alert and oriented on the unit this shift. Patient attended and actively participated in groups today. Patient denies suicidal ideation, homicidal ideation, auditory or visual hallucinations at the present time.  A: Scheduled medications are administered to patient as per MD orders. Emotional support and encouragement are provided. Patient is maintained on q.15 minute safety checks. Patient is informed to notify staff with questions or concerns. R: No adverse medication reactions are noted. Patient is cooperative with medication administration and treatment plan today. Patient is receptive, calm and cooperative on the unit at this time. Patient interacts well with others on the unit this shift. Patient  contracts for safety at this time. Patient remains safe at this time   Principal Problem: Major depressive disorder, recurrent episode, moderate (HCC) Diagnosis:   Patient Active Problem List   Diagnosis Date Noted  . Tobacco use disorder [F17.200] 05/01/2016  . Major depressive disorder, recurrent episode, moderate (HCC) [F33.1] 05/01/2016  . Cannabis use disorder, severe, dependence (HCC) [F12.20] 05/01/2016   Total Time spent with patient: 30 minutes   Past Medical History:  Past Medical History  Diagnosis Date  . Bipolar 1 disorder (HCC)    History reviewed. No pertinent past surgical history. Family History: History reviewed. No pertinent family history.   Social History:  History  Alcohol Use  . Yes     History  Drug Use Not on file    Social History   Social History  . Marital Status: Single    Spouse Name: N/A  . Number of Children: N/A  . Years of Education: N/A   Social History Main Topics  . Smoking status: Current Every Day Smoker -- .3 years    Types: Cigarettes  . Smokeless tobacco: None  . Alcohol Use: Yes  . Drug Use: None  . Sexual Activity: Not Asked   Other Topics Concern  . None   Social History Narrative   Sleep: Poor   Appetite:  Fair  Current Medications: Current Facility-Administered Medications  Medication Dose Route Frequency Provider Last Rate Last Dose  . acetaminophen (TYLENOL) tablet 650 mg  650 mg Oral Q6H PRN Audery AmelJohn T Clapacs, MD      . alum & mag hydroxide-simeth (MAALOX/MYLANTA) 200-200-20 MG/5ML suspension 30 mL  30 mL Oral Q4H PRN Audery Amel, MD      . FLUoxetine (PROZAC) capsule 10 mg  10 mg Oral Daily Jimmy Footman, MD   10 mg at 05/03/16 0815  . haloperidol (HALDOL) tablet 0.5 mg  0.5 mg Oral TID Jimmy Footman, MD   0.5 mg at 05/03/16 0815  . hydrOXYzine (ATARAX/VISTARIL) tablet 50 mg  50 mg Oral TID PRN Jimmy Footman, MD   50 mg at 05/01/16 1653  . hydrOXYzine (ATARAX/VISTARIL)  tablet 50 mg  50 mg Oral QHS Jimmy Footman, MD   50 mg at 05/02/16 2123  . magnesium hydroxide (MILK OF MAGNESIA) suspension 30 mL  30 mL Oral Daily PRN Audery Amel, MD      . nicotine (NICODERM CQ - dosed in mg/24 hours) patch 21 mg  21 mg Transdermal Daily Jimmy Footman, MD   21 mg at 05/03/16 0815  . white petrolatum (VASELINE) gel   Topical PRN Jimmy Footman, MD       Current Outpatient Prescriptions  Medication Sig Dispense Refill  . FLUoxetine (PROZAC) 10 MG capsule Take 1 capsule (10 mg total) by mouth daily. 30 capsule 0  . haloperidol (HALDOL) 0.5 MG tablet Take 1 tablet (0.5 mg total) by mouth every 8 (eight) hours as needed for agitation. 30 tablet 0  . hydrOXYzine (ATARAX/VISTARIL) 50 MG tablet Take 1 tablet (50 mg total) by mouth at bedtime as needed. 30 tablet 0    Lab Results:  Results for orders placed or performed during the hospital encounter of 04/30/16 (from the past 48 hour(s))  Urinalysis complete, with microscopic ([facility] only)     Status: Abnormal   Collection Time: 05/01/16  3:12 PM  Result Value Ref Range   Color, Urine YELLOW (A) YELLOW   APPearance CLEAR (A) CLEAR   Glucose, UA NEGATIVE NEGATIVE mg/dL   Bilirubin Urine NEGATIVE NEGATIVE   Ketones, ur NEGATIVE NEGATIVE mg/dL   Specific Gravity, Urine 1.018 1.005 - 1.030   Hgb urine dipstick NEGATIVE NEGATIVE   pH 6.0 5.0 - 8.0   Protein, ur NEGATIVE NEGATIVE mg/dL   Nitrite NEGATIVE NEGATIVE   Leukocytes, UA NEGATIVE NEGATIVE   RBC / HPF 0-5 0 - 5 RBC/hpf   WBC, UA 0-5 0 - 5 WBC/hpf   Bacteria, UA RARE (A) NONE SEEN   Squamous Epithelial / LPF 0-5 (A) NONE SEEN   Mucous PRESENT   Pregnancy, urine     Status: None   Collection Time: 05/01/16  3:12 PM  Result Value Ref Range   Preg Test, Ur NEGATIVE NEGATIVE  Chlamydia/NGC rt PCR ([facility] only)     Status: None   Collection Time: 05/02/16  5:12 PM  Result Value Ref Range   Specimen source GC/Chlam URINE, RANDOM     Chlamydia Tr NOT DETECTED NOT DETECTED   N gonorrhoeae NOT DETECTED NOT DETECTED    Comment: (NOTE) 100  This methodology has not been evaluated in pregnant women or in 200  patients with a history of hysterectomy. 300 400  This methodology will not be performed on patients less than 21  years of age.     Blood Alcohol level:  Lab Results  Component Value Date   ETH <5 04/30/2016    Musculoskeletal: Strength & Muscle Tone: within normal limits Gait & Station: normal Patient leans: N/A  Psychiatric Specialty Exam: Review of Systems  Constitutional: Negative.   HENT: Negative.   Eyes: Negative.   Respiratory: Negative.   Cardiovascular: Negative.  Gastrointestinal: Negative.   Genitourinary: Negative.   Musculoskeletal: Negative.   Skin: Negative.   Neurological: Negative.   Endo/Heme/Allergies: Negative.   Psychiatric/Behavioral: Positive for depression. Negative for suicidal ideas, hallucinations, memory loss and substance abuse. The patient is nervous/anxious and has insomnia.     Blood pressure 118/74, pulse 75, temperature 98.5 F (36.9 C), temperature source Oral, resp. rate 20, height  (1.676 m), weight 67.132 kg (148 lb), last menstrual period 04/23/2016, SpO2 100 %.Body mass index is 23.9 kg/(m^2).  General Appearance: Well Groomed  Patent attorney::  Good  Speech:  Normal Rate  Volume:  Normal  Mood:  Euthymic  Affect:  Appropriate  Thought Process:  Linear  Orientation:  Full (Time, Place, and Person)  Thought Content:  WDL  Suicidal Thoughts:  No  Homicidal Thoughts:  No  Memory:  Immediate;   Good Recent;   Fair Remote;   Fair  Judgement:  Fair  Insight:  Shallow  Psychomotor Activity:  Normal  Concentration:  Fair  Recall:  [employer] of Knowledge:Fair  Language: Fair  Akathisia:  No  Handed:    AIMS (if indicated):     Assets:  Communication Skills Desire for Improvement  ADL's:  Intact  Cognition: WNL  Sleep:  Number of Hours: 8    Treatment Plan Summary: Daily contact with patient to assess and evaluate symptoms and progress in treatment and Medication management   Depressed Mood: -Continue Prozac 10 mg daily  Agitation: -Continue Haldol 0.5 mg TID  Anxiety:  -Continue Vistaril 50 mg TID PRN   Insomnia: -Start scheduled Vistaril 50 mg QHS  Tobacco use disorder:  -will start a nicotine patch of 21 mg  Labs: -UA--- unremarkable  -Urine pregnancy--- negative   15 minute checks   Full diet   Status: Involuntary commitment   Disposition: Discharge home once stable on medications and follow up with outpatient therapy and resources at [facility].   Will discuss with SW as this case might need to be reported to CPS. Per Dr. Georjean Mode, from [facility], note: "my daughter makes me so angry. I'm scared I will hurt her. She is so undisciplined. Have to fight every day with myself. I can see why people hurt their kids. I feel like taking her life too cause no one would understand her"   [facility] from [facility] family medicine were reviewed.  Collateral info was obtained from mother  Preparing discharge for tomorrow   Jimmy Footman, MD 05/03/2016, 1:24 PM

## 2016-05-03 MED ORDER — HYDROXYZINE HCL 50 MG PO TABS: 50.0000 mg | ORAL_TABLET | Freq: Every evening | ORAL | Status: DC | PRN

## 2016-05-03 NOTE — Progress Notes (Signed)
  [facility] :  Will you be returning to the same living situation after discharge:  Yes,  home  At discharge, do you have transportation home?: Yes,  mother  Do you have the ability to pay for your medications: Yes,  Insurance   Release of information consent forms completed and in the chart;  Patient's signature needed at discharge.  Patient to Follow up at: Follow-up Information    Follow up with [facility] On 05/07/2016.   Why:  You have an appointment with [facility]. [facility] Monday May 15th at 7:00am.    Contact information:   [address] [place] [address] [phone]       Next level of care provider has [facility] Link:no  Safety Planning and Suicide Prevention discussed: Yes,  with patient and mother   Have you used any form of tobacco in the last 30 days? (Cigarettes, Smokeless Tobacco, Cigars, and/or Pipes): Yes  Has patient been referred to the Quitline?: Patient refused referral  Patient has been referred for addiction treatment: Pt. refused referral  Rondall Allegraandace L Rozetta Stumpp MSW, LCSWA  05/03/2016, 11:10 AM

## 2016-05-03 NOTE — Progress Notes (Signed)
Patient denies SI/HI

## 2016-05-03 NOTE — BHH Group Notes (Signed)
BHH Group Notes:  (Nursing/MHT/Case Management/Adjunct)  Date:  05/03/2016  Time:  1:29 AM  Type of Therapy:  Group Therapy  Participation Level:  Active  Participation Quality:  Appropriate  Affect:  Appropriate  Cognitive:  Appropriate  Insight:  Appropriate  Engagement in Group:  Engaged  Modes of Intervention:  n/a  Summary of Progress/Problems:  Veva Holesshley Imani Anavi Branscum 05/03/2016, 1:29 AM

## 2016-05-03 NOTE — BHH Counselor (Signed)
Adult Comprehensive Assessment  Patient ID: [name], female   DOB: 1991/01/17, 25 y.o.   MRN: [id number]  Information Source:   Patient   Current Stressors:  Employment / Job issues: unemployed, finds it difficult to keep work due to feeling distracted. Financial / Lack of resources (include bankruptcy): unemployed since Jan. Social relationships: limited support Substance abuse: Pt denies, but is documented elsewhere in the chart that she drinks alcohol daity  Living/Environment/Situation:  Living Arrangements: Children Living conditions (as described by patient or guardian): good How long has patient lived in current situation?: 3 years What is atmosphere in current home: Comfortable, Other (Comment) (stressful- due to depression)  Family History:  Marital status: Single Are you sexually active?: Yes What is your sexual orientation?: heterosexual Has your sexual activity been affected by drugs, alcohol, medication, or emotional stress?: yes, decreased libido Does patient have children?: Yes How many children?: 1 How is patient's relationship with their children?: 4021 month old [name], [name]  Childhood History:  By whom was/is the patient raised?: Mother Additional childhood history information: and step father, good childhood, no concerns Description of patient's relationship with caregiver when they were a child: good Patient's description of current relationship with people who raised him/her: good, wishes mother was more supportive/understanding of depression How were you disciplined when you got in trouble as a child/adolescent?: called police, sent to room, etc. Does patient have siblings?: Yes Number of Siblings: 2 Description of patient's current relationship with siblings: 1 brother, 1 sister Did patient suffer any verbal/emotional/physical/sexual abuse as a child?: No Did patient suffer from severe childhood neglect?: No Was the patient ever a victim of a  crime or a disaster?: No Witnessed domestic violence?: No Has patient been effected by domestic violence as an adult?: No  Education:  Highest grade of school patient has completed: 12th, some college Currently a student?: No Name of school: [employer] Learning disability?: No What learning problems does patient have?: feels she has difficulty reading and retaining information  Employment/Work Situation:   Employment situation: Unemployed What is the longest time patient has a held a job?: 2 years  Where was the patient employed at that time?: [employer], most recently working at [employer] for 11 months. Has patient ever been in the Eli Lilly and Companymilitary?: No Has patient ever served in combat?: No Did You Receive Any Psychiatric Treatment/Services While in the [employer]?: No Are There Guns or Other Weapons in Your Home?: No Are These Weapons Safely Secured?: No  Financial Resources:   Surveyor, quantityinancial resources: [employer], Food stamps Does patient have a representative payee or guardian?: No  Alcohol/Substance Abuse:   What has been your use of drugs/alcohol within the last 12 months?: Pt denies in assessment but has also been documented in chart that she drinks daily. If attempted suicide, did drugs/alcohol play a role in this?: No Alcohol/Substance Abuse Treatment Hx: Denies past history Has alcohol/substance abuse ever caused legal problems?: No  Social Support System:   Forensic psychologistatient's Community Support System: Poor Describe Community Support System: mother Type of faith/religion: none How does patient's faith help to cope with current illness?: none  Leisure/Recreation:   Leisure and Hobbies: being outside, walking  Strengths/Needs:   What things does the patient do well?: mother In what areas does patient struggle / problems for patient: feeling guilty that she's not able to do all of the "extra" stuff as a mom  Discharge Plan:   Does patient have access to transportation?: No (Mom helps when she can) Will  patient be returning to same living situation after discharge?: Yes Currently receiving community mental health services: No If no, would patient like referral for services when discharged?: Yes (What county?) ([facility] CST referral) Does patient have financial barriers related to discharge medications?: Yes Patient description of barriers related to discharge medications: no income at this time  Summary/Recommendations:   Summary and Recommendations (to be completed by the evaluator): Pt is 25 year old female, brought to ER for depression.  She has not had any outpatient services prior to going to [facility] in Crisis and being referred to [facility] ER.  While on the unit Pt will have the opportunity to participate in groups and therapeutic milieu. She will have medications managed and assistance with discharge planning.  Reccomendations for discharge include outpatient medicaation management and counseling.  Also reccomend referral to [facility] if available   as PT has minimal access to transportation,, is at risk of loosing her apartmentt and difficulty maintaining employment.  Vern Guerette L Jovoni Borkenhagen. MSW, [facility]  05/03/2016

## 2016-05-03 NOTE — Progress Notes (Signed)
Patient ID: [name], female   DOB: [birth year], 25 y.o.   MRN: [id number]  CSW completed CPS report with [facility] [place] Child Prote[facility]. Pt is aware of report.   Daisy FloroCandace L Maranda Marte MSW, LCSWA  05/03/2016 11:20 AM

## 2016-05-03 NOTE — BHH Group Notes (Signed)
BHH LCSW Group Therapy  05/03/2016 10:55 AM  Type of Therapy:  Group Therapy  Participation Level:  Active  Participation Quality:  Appropriate and Attentive  Affect:  Appropriate  Cognitive:  Alert, Appropriate and Oriented  Insight:  Improving  Engagement in Therapy:  Engaged  Modes of Intervention:  Discussion, Socialization and Support  Summary of Progress/Problems: Patient arrived 10 minutes late to group and was not able to participate in introduction exercise but did introduce herself and participated appropriately in group discussion. Patient reports that she listens to music as a coping skill and Emotion Regulation when she struggles with depression and that sometimes she listens to music that makes her cry to get it out and feels better afterward.    Lulu RidingIngle, Davina Howlett T, MSW, LCSW 05/03/2016, 10:55 AM

## 2016-05-03 NOTE — BHH Group Notes (Signed)
[facility]   05/03/2016 10:42 AM  Participation Quality:  Patient was called to group but did not attend.     Lulu RidingIngle, Yani Lal T, MSW, LCSW

## 2016-05-03 NOTE — Plan of Care (Signed)
Problem: Ineffective individual coping Goal: LTG: Patient will report a decrease in negative feelings Outcome: Progressing Patient denies SI/HI.      

## 2016-05-03 NOTE — Progress Notes (Signed)
.  D: Pt denies SI/HI/AVH. Pt is pleasant and cooperative. Pt appears less anxious and she is interacting with peers and staff appropriately.  A: Pt was offered support and encouragement. Pt was given scheduled medications. Pt was encouraged to attend groups. Q 15 minute checks were done for safety.  R:Pt attends groups and interacts well with peers and staff. Pt is taking medication. Pt has no complaints.Pt receptive to treatment and safety maintained on unit.   

## 2016-05-03 NOTE — Plan of Care (Signed)
Problem: Ineffective individual coping Goal: STG: Patient will remain free from self harm Outcome: Progressing Patient denies SI/HI .      

## 2016-05-03 NOTE — Progress Notes (Signed)
D: Pt denies SI/HI/AVH. Pt is pleasant and cooperative, affect flat and sad, denies pain and discomfort. Pt  appears less anxious and she is interacting with peers and staff appropriately.  A: Pt was offered support and encouragement. Pt was given scheduled medications. Pt was encouraged to attend groups. Q 15 minute checks were done for safety.  R: Pt attends  groups and interacts well with peers and staff. Pt is taking medication. Pt has no complaints.Pt receptive to treatment and safety maintained on unit.

## 2016-05-03 NOTE — Progress Notes (Signed)
Patient discharged home. DC instructions provided and explained. Medications reviewed. Rx given. All questions answered. Pt stable at discharge. Denies SI, HI, AVH.  

## 2016-05-03 NOTE — BHH Suicide Risk Assessment (Signed)
BHH INPATIENT:  Family/Significant Other Suicide Prevention Education  Suicide Prevention Education:  Education Completed;[name] (Mother) [phone] has been identified by the patient as the family member/significant other with whom the patient will be residing, and identified as the person(s) who will aid the patient in the event of a mental health crisis (suicidal ideations/suicide attempt).  With written consent from the patient, the family member/significant other has been provided the following suicide prevention education, prior to the and/or following the discharge of the patient.  The suicide prevention education provided includes the following:  Suicide risk factors  Suicide prevention and interventions  National Suicide Hotline telephone number  [facility] assessment telephone number  [facility] Emergency Assistance 911  [facility] and/or Residential Mobile Crisis Unit telephone number  Request made of family/significant other to:  Remove weapons (e.g., guns, rifles, knives), all items previously/currently identified as safety concern.    Remove drugs/medications (over-the-counter, prescriptions, illicit drugs), all items previously/currently identified as a safety concern.  The family member/significant other verbalizes understanding of the suicide prevention education information provided.  The family member/significant other agrees to remove the items of safety concern listed above.  [name] MSW, LCSWA  05/03/2016, 11:02 AM

## 2016-05-03 NOTE — Plan of Care (Signed)
Problem: Diagnosis: Increased Risk For Suicide Attempt Goal: LTG-Patient Will Show Positive Response to Medication LTG (by discharge) : Patient will show positive response to medication and will participate in the development of the discharge plan.  Outcome: Progressing Reports not HI, SI

## 2016-10-04 ENCOUNTER — Emergency Department
Admission: EM | Admit: 2016-10-04 | Discharge: 2016-10-04 | Disposition: A | Discharge status: Home / Self Care | Payer: Medicaid Other | Attending: Emergency Medicine | Admitting: Emergency Medicine

## 2016-10-04 ENCOUNTER — Encounter: Payer: Self-pay | Admitting: Emergency Medicine

## 2016-10-04 DIAGNOSIS — J029 Acute pharyngitis, unspecified: Secondary | ICD-10-CM | POA: Insufficient documentation

## 2016-10-04 DIAGNOSIS — F17210 Nicotine dependence, cigarettes, uncomplicated: Secondary | ICD-10-CM | POA: Insufficient documentation

## 2016-10-04 DIAGNOSIS — Z79899 Other long term (current) drug therapy: Secondary | ICD-10-CM | POA: Insufficient documentation

## 2016-10-04 LAB — POCT RAPID STREP A: STREPTOCOCCUS, GROUP A SCREEN (DIRECT): NEGATIVE

## 2016-10-04 MED ORDER — LIDOCAINE VISCOUS 2 % MT SOLN: 20.0000 mL | OROMUCOSAL | 0 refills | Status: DC | PRN

## 2016-10-04 MED ORDER — AMOXICILLIN 250 MG PO CHEW: 250.0000 mg | CHEWABLE_TABLET | Freq: Three times a day (TID) | ORAL | 0 refills | Status: DC

## 2016-10-04 NOTE — ED Triage Notes (Signed)
C/o sore throat.  No resp distress.  Mask applied.

## 2016-10-04 NOTE — ED Notes (Signed)
Sore throat X 3 days. Denies fevers. Odynophagia

## 2016-10-04 NOTE — ED Provider Notes (Signed)
[facility] Emergency Department Provider Note   ____________________________________________   None    (approximate)  I have reviewed the triage vital signs and the nursing notes.   HISTORY  Chief Complaint Sore Throat    HPI [name] is a 25 y.o. female who presents for evaluation of sore throat fever and chills 2 days. Patient states increased difficulty swallowing secondary to pain. Denies any cough or difficulty breathing.   Past Medical History:  Diagnosis Date  . Bipolar 1 disorder [facility](HCC)     Patient Active Problem List   Diagnosis Date Noted  . Tobacco use disorder 05/01/2016  . Major depressive disorder, recurrent episode, moderate (HCC) 05/01/2016  . Cannabis use disorder, severe, dependence (HCC) 05/01/2016    History reviewed. No pertinent surgical history.  Prior to Admission medications   Medication Sig Start Date End Date Taking? Authorizing Provider  amoxicillin (AMOXIL) 250 MG chewable tablet Chew 1 tablet (250 mg total) by mouth 3 (three) times daily. 10/04/16   Charmayne Sheerharles M Beers, PA-C  FLUoxetine (PROZAC) 10 MG capsule Take 1 capsule (10 mg total) by mouth daily. 05/02/16   Jimmy FootmanAndrea Hernandez-Gonzalez, MD  haloperidol (HALDOL) 0.5 MG tablet Take 1 tablet (0.5 mg total) by mouth every 8 (eight) hours as needed for agitation. 05/02/16   Jimmy FootmanAndrea Hernandez-Gonzalez, MD  hydrOXYzine (ATARAX/VISTARIL) 50 MG tablet Take 1 tablet (50 mg total) by mouth at bedtime as needed. 05/03/16   Jimmy FootmanAndrea Hernandez-Gonzalez, MD  lidocaine (XYLOCAINE) 2 % solution Use as directed 20 mLs in the mouth or throat as needed for mouth pain. 10/04/16   Evangeline Dakinharles M Beers, PA-C    Allergies Review of patient's allergies indicates no known allergies.  No family history on file.  Social History Social History  Substance Use Topics  . Smoking status: Current Every Day Smoker    Years: 0.30    Types: Cigarettes  . Smokeless tobacco: Never Used  .  Alcohol use Yes    Review of Systems Constitutional: No fever/chills ENT: Positive sore throat Cardiovascular: Denies chest pain. Respiratory: Denies shortness of breath. Musculoskeletal: Negative for back pain. Skin: Negative for rash. Neurological: Negative for headaches, focal weakness or numbness.  10-point ROS otherwise negative.  ____________________________________________   PHYSICAL EXAM:  VITAL SIGNS: ED Triage Vitals  Enc Vitals Group     BP 10/04/16 0959 124/69     Pulse Rate 10/04/16 0959 (!) 102     Resp 10/04/16 0959 18     Temp 10/04/16 0959 (!) 100.9 F (38.3 C)     Temp Source 10/04/16 0959 Oral     SpO2 10/04/16 0959 98 %     Weight 10/04/16 0959 178 lb (80.7 kg)     Height 10/04/16 0959 5\' 6"  (1.676 m)     Head Circumference --      Peak Flow --      Pain Score 10/04/16 1000 7     Pain Loc --      Pain Edu? --      Excl. in GC? --     Constitutional: Alert and oriented. Well appearing and in no acute distress. Head: Atraumatic. Nose: No congestion/rhinnorhea. Mouth/Throat: Mucous membranes are moist.  Oropharynx non-erythematous. Neck: No stridor.  Supple, positive cervical adenopathy. Full range of motion. Cardiovascular: Normal rate, regular rhythm. Grossly normal heart sounds.  Good peripheral circulation. Respiratory: Normal respiratory effort.  No retractions. Lungs CTAB. Musculoskeletal: No lower extremity tenderness nor edema.  No joint effusions. Neurologic:  Normal speech and language. No gross focal neurologic deficits are appreciated. No gait instability. Skin:  Skin is warm, dry and intact. No rash noted. Psychiatric: Mood and affect are normal. Speech and behavior are normal.  ____________________________________________   LABS (all labs ordered are listed, but only abnormal results are displayed)  Labs Reviewed - No data to  display ____________________________________________  EKG   ____________________________________________  RADIOLOGY   ____________________________________________   PROCEDURES  Procedure(s) performed: None  Procedures  Critical Care performed: No  ____________________________________________   INITIAL IMPRESSION / ASSESSMENT AND PLAN / ED COURSE  Pertinent labs & imaging results that were available during my care of the patient were reviewed by me and considered in my medical decision making (see chart for details).  Acute pharyngitis. Rx given for amoxicillin 3 times a day and viscous lidocaine. Patient follow-up with her PCP or return to ER for any worsening symptomology. Continue Tylenol and ibuprofen as needed for fever.  Clinical Course     ____________________________________________   FINAL CLINICAL IMPRESSION(S) / ED DIAGNOSES  Final diagnoses:  Pharyngitis, unspecified etiology      NEW MEDICATIONS STARTED DURING THIS VISIT:  New Prescriptions   AMOXICILLIN (AMOXIL) 250 MG CHEWABLE TABLET    Chew 1 tablet (250 mg total) by mouth 3 (three) times daily.   LIDOCAINE (XYLOCAINE) 2 % SOLUTION    Use as directed 20 mLs in the mouth or throat as needed for mouth pain.     Note:  This document was prepared using Dragon voice recognition software and may include unintentional dictation errors.   Evangeline Dakin, PA-C 10/04/16 1057    Emily Filbert, MD 10/04/16 1500

## 2016-10-05 LAB — CULTURE, GROUP A STREP (THRC)

## 2018-01-07 ENCOUNTER — Inpatient Hospital Stay
Admission: EM | Admit: 2018-01-07 | Discharge: 2018-01-09 | DRG: 758 | Disposition: A | Discharge status: Home / Self Care | Payer: Medicaid Other | Attending: Obstetrics and Gynecology | Admitting: Obstetrics and Gynecology

## 2018-01-07 ENCOUNTER — Encounter: Payer: Self-pay | Admitting: Medical Oncology

## 2018-01-07 ENCOUNTER — Other Ambulatory Visit: Payer: Self-pay

## 2018-01-07 ENCOUNTER — Emergency Department: Payer: Medicaid Other

## 2018-01-07 DIAGNOSIS — A549 Gonococcal infection, unspecified: Secondary | ICD-10-CM | POA: Diagnosis present

## 2018-01-07 DIAGNOSIS — N7011 Chronic salpingitis: Secondary | ICD-10-CM | POA: Diagnosis present

## 2018-01-07 DIAGNOSIS — N739 Female pelvic inflammatory disease, unspecified: Principal | ICD-10-CM | POA: Diagnosis present

## 2018-01-07 DIAGNOSIS — F17210 Nicotine dependence, cigarettes, uncomplicated: Secondary | ICD-10-CM | POA: Diagnosis present

## 2018-01-07 DIAGNOSIS — N730 Acute parametritis and pelvic cellulitis: Secondary | ICD-10-CM | POA: Diagnosis present

## 2018-01-07 LAB — COMPREHENSIVE METABOLIC PANEL
ALBUMIN: 4.3 g/dL (ref 3.5–5.0)
ALK PHOS: 64 U/L (ref 38–126)
ALT: 22 U/L (ref 14–54)
ANION GAP: 9 (ref 5–15)
AST: 19 U/L (ref 15–41)
BUN: 7 mg/dL (ref 6–20)
CALCIUM: 9.1 mg/dL (ref 8.9–10.3)
CHLORIDE: 104 mmol/L (ref 101–111)
CO2: 24 mmol/L (ref 22–32)
Creatinine, Ser: 0.73 mg/dL (ref 0.44–1.00)
GFR calc non Af Amer: >60 mL/min (ref >60)
Glucose, Bld: 109 mg/dL — ABNORMAL HIGH (ref 65–99)
POTASSIUM: 4.2 mmol/L (ref 3.5–5.1)
SODIUM: 137 mmol/L (ref 135–145)
Total Bilirubin: 1 mg/dL (ref 0.3–1.2)
Total Protein: 7.9 g/dL (ref 6.5–8.1)

## 2018-01-07 LAB — WET PREP, GENITAL
CLUE CELLS WET PREP: NONE SEEN
Sperm: NONE SEEN
Trich, Wet Prep: NONE SEEN
Yeast Wet Prep HPF POC: NONE SEEN

## 2018-01-07 LAB — CBC
HEMATOCRIT: 40.6 % (ref 35.0–47.0)
HEMOGLOBIN: 13.7 g/dL (ref 12.0–16.0)
MCH: 31.2 pg (ref 26.0–34.0)
MCHC: 33.8 g/dL (ref 32.0–36.0)
MCV: 92.3 fL (ref 80.0–100.0)
Platelets: 225 10*3/uL (ref 150–440)
RBC: 4.39 MIL/uL (ref 3.80–5.20)
RDW: 13.3 % (ref 11.5–14.5)
WBC: 13.1 10*3/uL — ABNORMAL HIGH (ref 3.6–11.0)

## 2018-01-07 LAB — CHLAMYDIA/NGC RT PCR (ARMC ONLY)
CHLAMYDIA TR: NOT DETECTED
N gonorrhoeae: DETECTED — AB

## 2018-01-07 LAB — URINALYSIS, COMPLETE (UACMP) WITH MICROSCOPIC
BACTERIA UA: NONE SEEN
Bilirubin Urine: NEGATIVE
GLUCOSE, UA: NEGATIVE mg/dL
KETONES UR: 20 mg/dL — AB
Leukocytes, UA: NEGATIVE
Nitrite: NEGATIVE
PROTEIN: NEGATIVE mg/dL
Specific Gravity, Urine: 1.012 (ref 1.005–1.030)
pH: 6 (ref 5.0–8.0)

## 2018-01-07 LAB — POCT PREGNANCY, URINE: PREG TEST UR: NEGATIVE

## 2018-01-07 LAB — LIPASE, BLOOD: LIPASE: 25 U/L (ref 11–51)

## 2018-01-07 LAB — PREGNANCY, URINE: Preg Test, Ur: NEGATIVE

## 2018-01-07 MED ORDER — KETOROLAC TROMETHAMINE 30 MG/ML IJ SOLN
30.0000 mg | Freq: Three times a day (TID) | INTRAMUSCULAR | Status: DC
  Administered 2018-01-07 – 2018-01-08 (×3): 30 mg via INTRAVENOUS
  Filled 2018-01-07 (×3): qty 1

## 2018-01-07 MED ORDER — DEXTROSE 5 % IV SOLN
2.0000 g | Freq: Once | INTRAVENOUS | Status: AC
  Administered 2018-01-07: 2 g via INTRAVENOUS
  Filled 2018-01-07: qty 2

## 2018-01-07 MED ORDER — SODIUM CHLORIDE 0.9 % IV BOLUS (SEPSIS)
1000.0000 mL | INTRAVENOUS | Status: AC
  Administered 2018-01-07: 1000 mL via INTRAVENOUS

## 2018-01-07 MED ORDER — DEXTROSE 5 % IV SOLN
2.0000 g | Freq: Four times a day (QID) | INTRAVENOUS | Status: DC
  Administered 2018-01-07 – 2018-01-09 (×7): 2 g via INTRAVENOUS
  Filled 2018-01-07 (×9): qty 2

## 2018-01-07 MED ORDER — CEFTRIAXONE SODIUM 250 MG IJ SOLR
250.0000 mg | Freq: Once | INTRAMUSCULAR | Status: AC
Start: 2018-01-07 — End: 2018-01-07
  Administered 2018-01-07: 250 mg via INTRAMUSCULAR
  Filled 2018-01-07: qty 250

## 2018-01-07 MED ORDER — DOCUSATE SODIUM 100 MG PO CAPS
100.0000 mg | ORAL_CAPSULE | Freq: Two times a day (BID) | ORAL | Status: DC
  Administered 2018-01-07 – 2018-01-09 (×4): 100 mg via ORAL
  Filled 2018-01-07 (×4): qty 1

## 2018-01-07 MED ORDER — ONDANSETRON HCL 4 MG PO TABS
4.0000 mg | ORAL_TABLET | Freq: Four times a day (QID) | ORAL | Status: DC | PRN
  Administered 2018-01-09: 4 mg via ORAL
  Filled 2018-01-07 (×2): qty 1

## 2018-01-07 MED ORDER — DOXYCYCLINE HYCLATE 100 MG PO TABS
100.0000 mg | ORAL_TABLET | Freq: Once | ORAL | Status: AC
  Administered 2018-01-07: 100 mg via ORAL
  Filled 2018-01-07: qty 1

## 2018-01-07 MED ORDER — DOXYCYCLINE HYCLATE 100 MG IV SOLR
100.0000 mg | Freq: Two times a day (BID) | INTRAVENOUS | Status: AC
  Administered 2018-01-07: 100 mg via INTRAVENOUS
  Filled 2018-01-07: qty 100

## 2018-01-07 MED ORDER — ONDANSETRON HCL 4 MG/2ML IJ SOLN: 4.0000 mg | Freq: Four times a day (QID) | INTRAMUSCULAR | Status: DC | PRN

## 2018-01-07 MED ORDER — MORPHINE SULFATE (PF) 2 MG/ML IV SOLN
2.0000 mg | INTRAVENOUS | Status: DC | PRN
  Administered 2018-01-07 – 2018-01-08 (×2): 2 mg via INTRAVENOUS
  Filled 2018-01-07 (×2): qty 1

## 2018-01-07 MED ORDER — LACTATED RINGERS IV SOLN
INTRAVENOUS | Status: DC
  Administered 2018-01-07 – 2018-01-08 (×3): via INTRAVENOUS

## 2018-01-07 MED ORDER — ONDANSETRON HCL 4 MG/2ML IJ SOLN
4.0000 mg | INTRAMUSCULAR | Status: AC
  Administered 2018-01-07: 4 mg via INTRAVENOUS
  Filled 2018-01-07: qty 2

## 2018-01-07 MED ORDER — MORPHINE SULFATE (PF) 4 MG/ML IV SOLN
4.0000 mg | Freq: Once | INTRAVENOUS | Status: AC
  Administered 2018-01-07: 4 mg via INTRAVENOUS
  Filled 2018-01-07: qty 1

## 2018-01-07 NOTE — H&P (Signed)
History and Physical Chief Complaint: Pelvic Pain  [name] is an 27 y.o. female.  HPI: Patient presented through the ER for pelvic pain. Workup by the ER physician showed thick yellow cervical discharge, CMT, and enlarged bilateral fallopian tunes on ultrasound. Patient   Past Medical History:  Diagnosis Date  . Bipolar 1 disorder ([place])    OB History  Gravida Para Term Preterm AB Living  1 1 1     1   SAB TAB Ectopic Multiple Live Births          1    # Outcome Date GA Lbr Len/2nd Weight Sex Delivery Anes PTL Lv  1 Term  [redacted]w[redacted]d  M Vag-Spont   LIV     Gynecological History  History reviewed. No pertinent surgical history.  Family History  Problem Relation Age of Onset  . Lupus Mother   . Breast cancer Other 583   Social History:  reports that she has been smoking cigarettes.  She has smoked for the past 0.30 years. she has never used smokeless tobacco. She reports that she drinks alcohol. Her drug history is not on file.  Allergies: No Known Allergies  Medications: I have reviewed the patient's current medications.   Results for orders placed or performed during the hospital encounter of 01/07/18 (from the past 48 hour(s))  Lipase, blood     Status: None   Collection Time: 01/07/18  8:31 AM  Result Value Ref Range   Lipase 25 11 - 51 U/L    Comment: [facility] [address] [address] Comprehensive metabolic panel     Status: Abnormal   Collection Time: 01/07/18  8:31 AM  Result Value Ref Range   Sodium 137 135 - 145 mmol/L   Potassium 4.2 3.5 - 5.1 mmol/L   Chloride 104 101 - 111 mmol/L   CO2 24 22 - 32 mmol/L   Glucose, Bld 109 (H) 65 - 99 mg/dL   BUN 7 6 - 20 mg/dL   Creatinine, Ser 0.73 0.44 - 1.00 mg/dL   Calcium 9.1 8.9 - 10.3 mg/dL   Total Protein 7.9 6.5 - 8.1 g/dL   Albumin 4.3 3.5 - 5.0 g/dL   AST 19 15 - 41 U/L   ALT 22 14 - 54 U/L   Alkaline Phosphatase 64 38 - 126 U/L   Total Bilirubin 1.0 0.3  - 1.2 mg/dL   GFR calc non Af Amer >60 >60 mL/min   GFR calc Af Amer >60 >60 mL/min    Comment: (NOTE) The eGFR has been calculated using the CKD EPI equation. This calculation has not been validated in all clinical situations. eGFR's persistently <60 mL/min signify possible Chronic Kidney Disease.    Anion gap 9 5 - 15    Comment: [facility] [address] [place] [address] CBC     Status: Abnormal   Collection Time: 01/07/18  8:31 AM  Result Value Ref Range   WBC 13.1 (H) 3.6 - 11.0 K/uL   RBC 4.39 3.80 - 5.20 MIL/uL   Hemoglobin 13.7 12.0 - 16.0 g/dL   HCT 40.6 35.0 - 47.0 %   MCV 92.3 80.0 - 100.0 fL   MCH 31.2 26.0 - 34.0 pg   MCHC 33.8 32.0 - 36.0 g/dL   RDW 13.3 11.5 - 14.5 %   Platelets 225 150 - 440 K/uL    Comment: [facility]  [place] [place] [address]  Urinalysis, Complete w Microscopic     Status: Abnormal   Collection Time: 01/07/18  8:31 AM  Result Value Ref Range   Color, Urine YELLOW (A) YELLOW   APPearance CLEAR (A) CLEAR   Specific Gravity, Urine 1.012 1.005 - 1.030   pH 6.0 5.0 - 8.0   Glucose, UA NEGATIVE NEGATIVE mg/dL   Hgb urine dipstick LARGE (A) NEGATIVE   Bilirubin Urine NEGATIVE NEGATIVE   Ketones, ur 20 (A) NEGATIVE mg/dL   Protein, ur NEGATIVE NEGATIVE mg/dL   Nitrite NEGATIVE NEGATIVE   Leukocytes, UA NEGATIVE NEGATIVE   RBC / HPF TOO NUMEROUS TO COUNT 0 - 5 RBC/hpf   WBC, UA 0-5 0 - 5 WBC/hpf   Bacteria, UA NONE SEEN NONE SEEN   Squamous Epithelial / LPF 0-5 (A) NONE SEEN   Mucus PRESENT     Comment: Performed at [facility], [address] [place], [place] [address]  Pregnancy, urine     Status: None   Collection Time: 01/07/18  8:31 AM  Result Value Ref Range   Preg Test, Ur NEGATIVE NEGATIVE    Comment: Performed at [facility], [address] [place], [place] [address]  Pregnancy, urine POC     Status: None   Collection Time:  01/07/18  8:43 AM  Result Value Ref Range   Preg Test, Ur NEGATIVE NEGATIVE    Comment:        THE SENSITIVITY OF THIS METHODOLOGY IS >24 mIU/mL   Chlamydia/NGC rt PCR ([facility] only)     Status: Abnormal   Collection Time: 01/07/18 10:15 AM  Result Value Ref Range   Specimen source GC/Chlam ENDOCERVICAL    Chlamydia Tr NOT DETECTED NOT DETECTED   N gonorrhoeae DETECTED (A) NOT DETECTED    Comment: (NOTE) 100  This methodology has not been evaluated in pregnant women or in 200  patients with a history of hysterectomy. 300 400  This methodology will not be performed on patients less than 59  years of age. Performed at [facility], [address] [place], [place] [address]   Wet prep, genital     Status: Abnormal   Collection Time: 01/07/18 10:15 AM  Result Value Ref Range   Yeast Wet Prep HPF POC NONE SEEN NONE SEEN   Trich, Wet Prep NONE SEEN NONE SEEN   Clue Cells Wet Prep HPF POC NONE SEEN NONE SEEN   WBC, Wet Prep HPF POC FEW (A) NONE SEEN   Sperm NONE SEEN     Comment: Performed at [facility], [address] [place], [place] [address]    US Transvaginal Non-ob  Result Date: 01/07/2018 CLINICAL DATA:  Severe pelvic pain x2 days, cervical motion tenderness, evaluate for tubo-ovarian abscess EXAM: TRANSABDOMINAL AND TRANSVAGINAL ULTRASOUND OF PELVIS TECHNIQUE: Both transabdominal and transvaginal ultrasound examinations of the pelvis were performed. Transabdominal technique was performed for global imaging of the pelvis including uterus, ovaries, adnexal regions, and pelvic cul-de-sac. It was necessary to proceed with endovaginal exam following the transabdominal exam to visualize the endometrium and bilateral ovaries. COMPARISON:  None FINDINGS: Uterus Measurements: 7.9 x 4.6 x 5.4 cm. No fibroids or other mass visualized. Endometrium Thickness: 5 mm.  No focal abnormality visualized. Right ovary Measurements: 4.6 x 1.9 x 2.7 cm. Mildly dilated tubular  structure adjacent to the right ovary, favoring hydrosalpinx. Left ovary Measurements: 4.8 x 1.6 x 2.5 cm. Moderately dilated tubular structure adjacent to the  left ovary, favoring hydrosalpinx. Other findings Trace pelvic fluid. IMPRESSION: Suspected bilateral hydrosalpinx, left greater than right. Bilateral ovaries are within normal limits. Electronically Signed   By: Julian Hy M.D.   On: 01/07/2018 12:09   US Pelvis Complete  Result Date: 01/07/2018 CLINICAL DATA:  Severe pelvic pain x2 days, cervical motion tenderness, evaluate for tubo-ovarian abscess EXAM: TRANSABDOMINAL AND TRANSVAGINAL ULTRASOUND OF PELVIS TECHNIQUE: Both transabdominal and transvaginal ultrasound examinations of the pelvis were performed. Transabdominal technique was performed for global imaging of the pelvis including uterus, ovaries, adnexal regions, and pelvic cul-de-sac. It was necessary to proceed with endovaginal exam following the transabdominal exam to visualize the endometrium and bilateral ovaries. COMPARISON:  None FINDINGS: Uterus Measurements: 7.9 x 4.6 x 5.4 cm. No fibroids or other mass visualized. Endometrium Thickness: 5 mm.  No focal abnormality visualized. Right ovary Measurements: 4.6 x 1.9 x 2.7 cm. Mildly dilated tubular structure adjacent to the right ovary, favoring hydrosalpinx. Left ovary Measurements: 4.8 x 1.6 x 2.5 cm. Moderately dilated tubular structure adjacent to the left ovary, favoring hydrosalpinx. Other findings Trace pelvic fluid. IMPRESSION: Suspected bilateral hydrosalpinx, left greater than right. Bilateral ovaries are within normal limits. Electronically Signed   By: Julian Hy M.D.   On: 01/07/2018 12:09    Review of Systems  Constitutional: Negative for chills, fever, malaise/fatigue and weight loss.  HENT: Negative for congestion, hearing loss and sinus pain.   Eyes: Negative for blurred vision and double vision.  Respiratory: Negative for cough, sputum production,  shortness of breath and wheezing.   Cardiovascular: Negative for chest pain, palpitations, orthopnea and leg swelling.  Gastrointestinal: Positive for abdominal pain and nausea. Negative for constipation, diarrhea and vomiting.  Genitourinary: Negative for dysuria, flank pain, frequency, hematuria and urgency.  Musculoskeletal: Negative for back pain, falls and joint pain.  Skin: Negative for itching and rash.  Neurological: Negative for dizziness and headaches.  Psychiatric/Behavioral: Negative for depression, substance abuse and suicidal ideas. The patient is not nervous/anxious.    Blood pressure 119/74, pulse 89, temperature 100.1 F (37.8 C), temperature source Oral, resp. rate 18, height 5' 6"  (1.676 m), weight 170 lb (77.1 kg), last menstrual period 12/31/2017, SpO2 100 %. Physical Exam  Assessment/Plan:  Pelvic Inflammatory Disease. Will admit patient for IV antibiotics and pain management Will treat with Cefoxitin 2g IV every 6 hours and Doxycycline 129m IV every 12 hours.  Toradol and Morphine PRN Pain.   Horris Speros R Glendell Fouse 01/07/2018, 1:46 PM

## 2018-01-07 NOTE — ED Triage Notes (Signed)
Pt reports pain to lower abd/pelvic area with vaginal bleeding. Pt states that when she stands up she feels "like something is going to fall out".

## 2018-01-07 NOTE — ED Provider Notes (Signed)
[facility] Emergency Department Provider Note  ____________________________________________   First MD Initiated Contact with Patient 01/07/18 1009     (approximate)  I have reviewed the triage vital signs and the nursing notes.   HISTORY  Chief Complaint Abdominal Pain    HPI [name] is a 27 y.o. female with medical history and active problems as described below who presents for evaluation of about 2 days of pelvic pain and pressure.  This comes in the setting of about a week of light but persistent vaginal bleeding.  She states that she does not have a regular period because she is on Depoprovera, but then she clarified that she has not had an injection in at least 6 or 7 months.  She states that it has been a couple of months since she was sexually active.  She describes the pelvic pain as dull and pressure-like and is worse when she walks around and pushes on her abdomen.  She states she feels like "something is going to fall out".  She describes her symptoms as severe and nothing makes them better.  She has some burning discomfort when she urinates.  She denies fever/chills, chest pain, vomiting (though she has had nausea), and upper abdominal pain.   Past Medical History:  Diagnosis Date  . Bipolar 1 disorder [facility])     Patient Active Problem List   Diagnosis Date Noted  . PID (acute pelvic inflammatory disease) 01/07/2018  . Tobacco use disorder 05/01/2016  . Major depressive disorder, recurrent episode, moderate (HCC) 05/01/2016  . Cannabis use disorder, severe, dependence (HCC) 05/01/2016    No past surgical history on file.  Prior to Admission medications   Not on File    Allergies Patient has no known allergies.  Family History  Problem Relation Age of Onset  . Lupus Mother   . Breast cancer Other 64    Social History Social History   Tobacco Use  . Smoking status: Current Every Day Smoker    Years: 0.30    Types:  Cigarettes  . Smokeless tobacco: Never Used  Substance Use Topics  . Alcohol use: Yes  . Drug use: Not on file    Review of Systems Constitutional: No subjective fever/chills Eyes: No visual disturbances ENT: No sore throat or difficulty swallowing Cardiovascular: Denies chest pain. Respiratory: Denies shortness of breath. Gastrointestinal: Lower abdominal/pelvic pain times 2 days with nausea but no vomiting . Genitourinary: Mild burning dysuria.  Pelvic pain times 2 days.  Vaginal bleeding for more than a week. Musculoskeletal: No pain in extremities Integumentary: Negative for rash. Neurological: Negative for headaches, focal weakness or numbness.   ____________________________________________   PHYSICAL EXAM:  VITAL SIGNS: ED Triage Vitals [01/07/18 0828]  Enc Vitals Group     BP 126/79     Pulse Rate 99     Resp 16     Temp 100.1 F (37.8 C)     Temp Source Oral     SpO2 100 %     Weight 77.1 kg (170 lb)     Height 1.676 m (5\' 6" )     Head Circumference      Peak Flow      Pain Score 10     Pain Loc      Pain Edu?      Excl. in GC?     Constitutional: Alert and oriented.  Generally well-appearing but does appear uncomfortable Eyes: Conjunctivae are normal.  Head: Atraumatic. Nose: No  congestion/rhinnorhea. Mouth/Throat: Mucous membranes are moist. Neck: No stridor.  No meningeal signs.   Cardiovascular: Borderline tachycardia, regular rhythm. Good peripheral circulation. Grossly normal heart sounds. Respiratory: Normal respiratory effort.  No retractions. Lungs CTAB. Gastrointestinal: Soft with no distention.  Tenderness to palpation in the suprapubic region.  No rebound. Genitourinary: Normal external exam.  Small quantity of brownish bloody discharge in the vagina and on the cervix.  The cervix appears inflamed consistent with a nonspecific cervicitis.  On bimanual exam the patient has moderate to severe cervical motion tenderness and no specific adnexal  tenderness though the exam is uncomfortable enough it is difficult to localize beyond the cervix.  ED chaperone present throughout exam. Musculoskeletal: No lower extremity tenderness nor edema. No gross deformities of extremities. Neurologic:  Normal speech and language. No gross focal neurologic deficits are appreciated.  Skin:  Skin is warm, dry and intact. No rash noted. Psychiatric: Mood and affect are normal. Speech and behavior are normal.  ____________________________________________   LABS (all labs ordered are listed, but only abnormal results are displayed)  Labs Reviewed  CHLAMYDIA/NGC RT PCR ([facility] ONLY) - Abnormal; Notable for the following components:      Result Value   N gonorrhoeae DETECTED (*)    All other components within normal limits  WET PREP, GENITAL - Abnormal; Notable for the following components:   WBC, Wet Prep HPF POC FEW (*)    All other components within normal limits  COMPREHENSIVE METABOLIC PANEL - Abnormal; Notable for the following components:   Glucose, Bld 109 (*)    All other components within normal limits  CBC - Abnormal; Notable for the following components:   WBC 13.1 (*)    All other components within normal limits  URINALYSIS, COMPLETE (UACMP) WITH MICROSCOPIC - Abnormal; Notable for the following components:   Color, Urine YELLOW (*)    APPearance CLEAR (*)    Hgb urine dipstick LARGE (*)    Ketones, ur 20 (*)    Squamous Epithelial / LPF 0-5 (*)    All other components within normal limits  LIPASE, BLOOD  PREGNANCY, URINE  HIV ANTIBODY (ROUTINE TESTING)  POCT PREGNANCY, URINE   ____________________________________________  EKG  None - EKG not ordered by ED physician ____________________________________________  RADIOLOGY   US Transvaginal Non-ob  Result Date: 01/07/2018 CLINICAL DATA:  Severe pelvic pain x2 days, cervical motion tenderness, evaluate for tubo-ovarian abscess EXAM: TRANSABDOMINAL AND TRANSVAGINAL  ULTRASOUND OF PELVIS TECHNIQUE: Both transabdominal and transvaginal ultrasound examinations of the pelvis were performed. Transabdominal technique was performed for global imaging of the pelvis including uterus, ovaries, adnexal regions, and pelvic cul-de-sac. It was necessary to proceed with endovaginal exam following the transabdominal exam to visualize the endometrium and bilateral ovaries. COMPARISON:  None FINDINGS: Uterus Measurements: 7.9 x 4.6 x 5.4 cm. No fibroids or other mass visualized. Endometrium Thickness: 5 mm.  No focal abnormality visualized. Right ovary Measurements: 4.6 x 1.9 x 2.7 cm. Mildly dilated tubular structure adjacent to the right ovary, favoring hydrosalpinx. Left ovary Measurements: 4.8 x 1.6 x 2.5 cm. Moderately dilated tubular structure adjacent to the left ovary, favoring hydrosalpinx. Other findings Trace pelvic fluid. IMPRESSION: Suspected bilateral hydrosalpinx, left greater than right. Bilateral ovaries are within normal limits. Electronically Signed   By: Charline Bills M.D.   On: 01/07/2018 12:09   US Pelvis Complete  Result Date: 01/07/2018 CLINICAL DATA:  Severe pelvic pain x2 days, cervical motion tenderness, evaluate for tubo-ovarian abscess EXAM: TRANSABDOMINAL AND TRANSVAGINAL ULTRASOUND OF  PELVIS TECHNIQUE: Both transabdominal and transvaginal ultrasound examinations of the pelvis were performed. Transabdominal technique was performed for global imaging of the pelvis including uterus, ovaries, adnexal regions, and pelvic cul-de-sac. It was necessary to proceed with endovaginal exam following the transabdominal exam to visualize the endometrium and bilateral ovaries. COMPARISON:  None FINDINGS: Uterus Measurements: 7.9 x 4.6 x 5.4 cm. No fibroids or other mass visualized. Endometrium Thickness: 5 mm.  No focal abnormality visualized. Right ovary Measurements: 4.6 x 1.9 x 2.7 cm. Mildly dilated tubular structure adjacent to the right ovary, favoring hydrosalpinx.  Left ovary Measurements: 4.8 x 1.6 x 2.5 cm. Moderately dilated tubular structure adjacent to the left ovary, favoring hydrosalpinx. Other findings Trace pelvic fluid. IMPRESSION: Suspected bilateral hydrosalpinx, left greater than right. Bilateral ovaries are within normal limits. Electronically Signed   By: Charline Bills M.D.   On: 01/07/2018 12:09    ____________________________________________   PROCEDURES  Critical Care performed: No   Procedure(s) performed:   Procedures   ____________________________________________   INITIAL IMPRESSION / ASSESSMENT AND PLAN / ED COURSE  As part of my medical decision making, I reviewed the following data within the electronic MEDICAL RECORD NUMBER Nursing notes reviewed and incorporated, Labs reviewed  and A consult was requested and obtained from this/these consultant(s) (OB/GYN).    Differential diagnosis includes, but is not limited to, STD/PID, tubo-ovarian abscess, pregnancy (ruled out quickly with urine pregnancy test) including all the various complications such as ectopic, appendicitis, diverticulitis, urinary tract infection, renal colic, nonspecific intra-abdominal pathology such as an intra-abdominal abscess other than tubo-ovarian abscess.  It may be notable that the patient has a temperature of 100.1 and a heart rate of nearly 100 in the setting of pelvic pain and, after the initial pelvic exam, what appears to be cervicitis.  I will treat her empirically for PID but I am obtaining an ultrasound to look for any evidence of tubo-ovarian abscess.  At this point for a young and otherwise healthy patient I do not feel she requires a CT scan but I will consider it when I reassess her after the ultrasound.  Still exam and workup, however, I think it is much more likely that she is suffering from PID and/or TOA than appendicitis or diverticulitis (or any other conditions seen specifically on CT scan).  Patient and mother agree with plan at  this time.  Morphine and Zofran and fluids.  Antibiotics include ceftriaxone 250 mg IM and doxycycline 100 mg PO, anticipating a 2-week outpatient course of doxy for PID. Clinical Course as of Jan 08 1344  Tue Jan 07, 2018  1220 Gonorrhea positive and the ultrasound is concerning for bilateral hydrosalpinx.  I am concerned that the patient needs IV antibiotics for PID/developing TOA.  I have paged Dr. Jerene Pitch who is on-call for unassigned OB/GYN to discuss. N gonorrhoeae: (!) DETECTED [CF]  1227 Updated the patient, eating to hear back from OB/GYN.  I ordered cefoxitin 2g IV as per current inpatient PID/TOA treatment recommendations.  Patient has already received doxycycline, which is also part of the treatment algorithm.  [CF]  1237 Discussed case by phone with Dr. Jerene Pitch who agreed with my plan and management.  She will come to the ED to evaluate the patient in person and will admit.  [CF]  1344 Discussed the case in person with Dr. Jerene Pitch after she evaluated the patient in the emergency department.  She continues to agree with the plan and has placed admission orders.  [CF]  Clinical Course User Index [CF] Loleta RoseForbach, Said Rueb, MD    ____________________________________________  FINAL CLINICAL IMPRESSION(S) / ED DIAGNOSES  Final diagnoses:  Pelvic inflammatory disease (PID)  Gonorrhea  Hydrosalpinx     MEDICATIONS GIVEN DURING THIS VISIT:  Medications  lactated ringers infusion (not administered)  docusate sodium (COLACE) capsule 100 mg (not administered)  ondansetron (ZOFRAN) tablet 4 mg (not administered)    Or  ondansetron (ZOFRAN) injection 4 mg (not administered)  morphine 2 MG/ML injection 2 mg (not administered)  ketorolac (TORADOL) 30 MG/ML injection 30 mg (not administered)  doxycycline (VIBRAMYCIN) 100 mg in dextrose 5 % 250 mL IVPB (not administered)  cefOXitin (MEFOXIN) 2 g in dextrose 5 % 50 mL IVPB (not administered)  morphine 4 MG/ML injection 4 mg (4 mg  Intravenous Given 01/07/18 1058)  ondansetron (ZOFRAN) injection 4 mg (4 mg Intravenous Given 01/07/18 1056)  cefTRIAXone (ROCEPHIN) injection 250 mg (250 mg Intramuscular Given 01/07/18 1058)  doxycycline (VIBRA-TABS) tablet 100 mg (100 mg Oral Given 01/07/18 1058)  cefOXitin (MEFOXIN) 2 g in dextrose 5 % 50 mL IVPB (2 g Intravenous New Bag/Given 01/07/18 1300)  sodium chloride 0.9 % bolus 1,000 mL (1,000 mLs Intravenous New Bag/Given 01/07/18 1249)     ED Discharge Orders    None       Note:  This document was prepared using Dragon voice recognition software and may include unintentional dictation errors.    Loleta RoseForbach, Thanvi Blincoe, MD 01/07/18 1345

## 2018-01-07 NOTE — ED Notes (Signed)
Patient transported to US 

## 2018-01-08 DIAGNOSIS — N7011 Chronic salpingitis: Secondary | ICD-10-CM

## 2018-01-08 DIAGNOSIS — A549 Gonococcal infection, unspecified: Secondary | ICD-10-CM

## 2018-01-08 DIAGNOSIS — N739 Female pelvic inflammatory disease, unspecified: Secondary | ICD-10-CM

## 2018-01-08 LAB — CBC
HEMATOCRIT: 34.1 % — AB (ref 35.0–47.0)
HEMOGLOBIN: 11.5 g/dL — AB (ref 12.0–16.0)
MCH: 31.7 pg (ref 26.0–34.0)
MCHC: 33.7 g/dL (ref 32.0–36.0)
MCV: 94 fL (ref 80.0–100.0)
Platelets: 173 10*3/uL (ref 150–440)
RBC: 3.63 MIL/uL — AB (ref 3.80–5.20)
RDW: 13.5 % (ref 11.5–14.5)
WBC: 5.8 10*3/uL (ref 3.6–11.0)

## 2018-01-08 LAB — HIV ANTIBODY (ROUTINE TESTING W REFLEX): HIV SCREEN 4TH GENERATION: NONREACTIVE

## 2018-01-08 MED ORDER — OXYCODONE-ACETAMINOPHEN 5-325 MG PO TABS
1.0000 | ORAL_TABLET | ORAL | Status: DC | PRN
  Administered 2018-01-08 – 2018-01-09 (×6): 2 via ORAL
  Filled 2018-01-08 (×7): qty 2

## 2018-01-08 MED ORDER — IBUPROFEN 600 MG PO TABS
600.0000 mg | ORAL_TABLET | Freq: Four times a day (QID) | ORAL | Status: DC
  Administered 2018-01-08 (×3): 600 mg via ORAL
  Filled 2018-01-08 (×3): qty 1

## 2018-01-08 NOTE — Progress Notes (Addendum)
Benign Gynecology Progress Note  Admission Date: 01/07/2018 Current Date: 01/08/2018  [name] is a 27 y.o. G1P1001 HD#1 History complicated by: Patient Active Problem List   Diagnosis Date Noted  . PID (acute pelvic inflammatory disease) 01/07/2018  . Tobacco use disorder 05/01/2016  . Major depressive disorder, recurrent episode, moderate (HCC) 05/01/2016  . Cannabis use disorder, severe, dependence (HCC) 05/01/2016   ROS and patient/family/surgical history, located on admission H&P note dated 1/15/2019have been reviewed, and there are no changes except as noted below.  Yesterday/Overnight Events:  Afebrile, vitals stable, receiving IV antibiotics as scheduled.  Subjective:  Pain improved from yesterday. Worse when stomach is full and with activity. Pain control is adequate with medications. Voiding without difficulty. Tolerating a regular diet. Wants to shower.  Objective:   Vitals:   01/07/18 2021 01/07/18 2358 01/08/18 0431 01/08/18 0833  BP: (!) 105/58 98/62 (!) 105/51 106/66  Pulse: 71 65 (!) 53 69  Resp:  18 18 18   Temp: 98 F (36.7 C) 97.9 F (36.6 C) (!) 97.4 F (36.3 C) 98.2 F (36.8 C)  TempSrc: Oral Oral Oral Oral  SpO2: 100% 100%  100%  Weight:      Height:       Temp:  [97.4 F (36.3 C)-98.2 F (36.8 C)] 98.2 F (36.8 C) (01/16 0833) Pulse Rate:  [53-89] 69 (01/16 0833) Resp:  [18] 18 (01/16 0833) BP: (98-119)/(51-74) 106/66 (01/16 0833) SpO2:  [100 %] 100 % (01/16 0833) Weight:  [170 lb (77.1 kg)] 170 lb (77.1 kg) (01/15 1650) I/O last 3 completed shifts: In: 1240 [P.O.:240; IV Piggyback:1000] Out: 650 [Urine:650] No intake/output data recorded.  Intake/Output Summary (Last 24 hours) at 01/08/2018 0941 Last data filed at 01/08/2018 0500 Gross per 24 hour  Intake 1240 ml  Output 650 ml  Net 590 ml     Current Vital Signs 24h Vital Sign Ranges  T 98.2 F (36.8 C) Temp  Avg: 97.9 F (36.6 C)  Min: 97.4 F (36.3 C)  Max: 98.2 F (36.8  C)  BP 106/66 BP  Min: 98/62  Max: 119/74  HR 69 Pulse  Avg: 71  Min: 53  Max: 89  RR 18 Resp  Avg: 18  Min: 18  Max: 18  SaO2 100 % Not Delivered SpO2  Avg: 100 %  Min: 100 %  Max: 100 %           24 Hour I/O Current Shift I/O  Time Ins Outs 01/15 0701 - 01/16 0700 In: 1240 [P.O.:240] Out: 650 [Urine:650] No intake/output data recorded.    Physical exam: General appearance: alert, cooperative and appears stated age Abdomen: normal findings: soft, non-tender GU: No gross VB Extremities: no redness or tenderness in the calves or thighs, no edema Skin: no lesions Psych: appropriate  Labs:  Recent Labs  Lab 01/07/18 0831  NA 137  K 4.2  CL 104  CO2 24  BUN 7  CREATININE 0.73  GLUCOSE 109*   Recent Labs  Lab 01/07/18 0831 01/08/18 0634  WBC 13.1* 5.8  HGB 13.7 11.5*  HCT 40.6 34.1*  PLT 225 173    Recent Labs  Lab 01/07/18 0831  CALCIUM 9.1   No results for input(s): INR, APTT in the last 168 hours.    Recent Labs  Lab 01/07/18 0831  ALKPHOS 64  BILITOT 1.0  PROT 7.9  ALT 22  AST 19    Recent Labs  Lab 01/07/18 0831 01/08/18 0634  WBC 13.1*  5.8  HGB 13.7 11.5*  HCT 40.6 34.1*  PLT 225 173   Recent Labs  Lab 01/07/18 0831  NA 137  K 4.2  CL 104  CO2 24  BUN 7  CREATININE 0.73  CALCIUM 9.1  PROT 7.9  BILITOT 1.0  ALKPHOS 64  ALT 22  AST 19  GLUCOSE 109*   Pelvic US: suspected bilateral hydrosalpinx, left greater than right. Bilateral ovaries wnl.  Assessment & Plan:  27 year old female with PID, positive gonorrhea, pelvic pain.  GYN: Gonorrhea positive, continue tx Pain: Transition to PO pain medication FEN/GI: Saline lock between abx tx; regular diet PPx: TED hose Dispo: Discharge home tomorrow after 48 hours of IV antibiotic therapy complete.  Marcelyn Bruins, CNM 01/08/2018  9:46 AM

## 2018-01-09 ENCOUNTER — Encounter: Payer: Self-pay | Admitting: Certified Nurse Midwife

## 2018-01-09 DIAGNOSIS — N739 Female pelvic inflammatory disease, unspecified: Secondary | ICD-10-CM

## 2018-01-09 DIAGNOSIS — A549 Gonococcal infection, unspecified: Secondary | ICD-10-CM

## 2018-01-09 DIAGNOSIS — N7011 Chronic salpingitis: Secondary | ICD-10-CM

## 2018-01-09 MED ORDER — IBUPROFEN 600 MG PO TABS
600.0000 mg | ORAL_TABLET | Freq: Four times a day (QID) | ORAL | Status: DC
  Administered 2018-01-09 (×2): 600 mg via ORAL
  Filled 2018-01-09 (×2): qty 1

## 2018-01-09 MED ORDER — DOXYCYCLINE HYCLATE 100 MG PO TABS
100.0000 mg | ORAL_TABLET | Freq: Two times a day (BID) | ORAL | Status: DC
  Administered 2018-01-09: 100 mg via ORAL
  Filled 2018-01-09 (×2): qty 1

## 2018-01-09 MED ORDER — DOXYCYCLINE HYCLATE 100 MG PO TABS: 100.0000 mg | ORAL_TABLET | Freq: Two times a day (BID) | ORAL | 0 refills | Status: DC

## 2018-01-09 MED ORDER — IBUPROFEN 600 MG PO TABS: 600.0000 mg | ORAL_TABLET | Freq: Four times a day (QID) | ORAL | 1 refills | Status: DC | PRN

## 2018-01-09 MED ORDER — OXYCODONE-ACETAMINOPHEN 5-325 MG PO TABS: 1.0000 | ORAL_TABLET | Freq: Four times a day (QID) | ORAL | 0 refills | Status: DC | PRN

## 2018-01-09 NOTE — Progress Notes (Signed)
Patient understands all discharge instructions, the need to attend follow up appointments, and medication administration. Patient discharge via wheelchair with auxillary.

## 2018-01-09 NOTE — Discharge Summary (Signed)
Physician Discharge Summary  Patient ID: [name] MRN: [id number] DOB/AGE: October 24, 1991 27 y.o.  Admit date: 01/07/2018 Discharge date: 01/09/2018  Admission Diagnoses: Pelvic inflammatory disease  Discharge Diagnoses:  Active Problems:   PID (acute pelvic inflammatory disease) Positive gonorrhea Bilateral hydrosalpinyx  Discharged Condition: stable  Hospital Course: 27 year old G1 P1 with LMP 12/31/2017? Presented to the ER with pelvic pain and fever.  Workup by the ER doctor showed positive cervical motion tenderness on exam and enlarged bilateral Fallopian tubes on ultrasound. Her gonorrhea PCR test was positive. Her admission WBC was 13.1K. She was admitted for IV antibiotics and pain management. She was begun on Mefoxin 2 GM IV q6 hours and doxycycline 100 mgm BID. (for some unknown reason the doxycycline was discontinued after 1/15, did not receive doxycycline  1/16, but was restarted 1/17). Her pain improved and was managed on Percocet. Her WBC decreased to 5.8K on 1/16. She was discharged home on 1/17 after being afebrile x 48 hours and after completing 48 hours of Mefoxin.   Consults: None  Significant Diagnostic Studies:  Recent Results (from the past 2160 hour(s))  Lipase, blood     Status: None   Collection Time: 01/07/18  8:31 AM  Result Value Ref Range   Lipase 25 11 - 51 U/L    Comment: Performed at [facility], [address] [place], [place] [address]  Comprehensive metabolic panel     Status: Abnormal   Collection Time: 01/07/18  8:31 AM  Result Value Ref Range   Sodium 137 135 - 145 mmol/L   Potassium 4.2 3.5 - 5.1 mmol/L   Chloride 104 101 - 111 mmol/L   CO2 24 22 - 32 mmol/L   Glucose, Bld 109 (H) 65 - 99 mg/dL   BUN 7 6 - 20 mg/dL   Creatinine, Ser 0.73 0.44 - 1.00 mg/dL   Calcium 9.1 8.9 - 10.3 mg/dL   Total Protein 7.9 6.5 - 8.1 g/dL   Albumin 4.3 3.5 - 5.0 g/dL   AST 19 15 - 41 U/L   ALT 22 14 - 54 U/L   Alkaline Phosphatase 64 38 -  126 U/L   Total Bilirubin 1.0 0.3 - 1.2 mg/dL   GFR calc non Af Amer >60 >60 mL/min   GFR calc Af Amer >60 >60 mL/min    Comment: (NOTE) The eGFR has been calculated using the CKD EPI equation. This calculation has not been validated in all clinical situations. eGFR's persistently <60 mL/min signify possible Chronic Kidney Disease.    Anion gap 9 5 - 15    Comment: Performed at [facility], [address] [place], [place] [address]  CBC     Status: Abnormal   Collection Time: 01/07/18  8:31 AM  Result Value Ref Range   WBC 13.1 (H) 3.6 - 11.0 K/uL   RBC 4.39 3.80 - 5.20 MIL/uL   Hemoglobin 13.7 12.0 - 16.0 g/dL   HCT 40.6 35.0 - 47.0 %   MCV 92.3 80.0 - 100.0 fL   MCH 31.2 26.0 - 34.0 pg   MCHC 33.8 32.0 - 36.0 g/dL   RDW 13.3 11.5 - 14.5 %   Platelets 225 150 - 440 K/uL    Comment: Performed at [facility], [address] [place], [place] [address]  Urinalysis, Complete w Microscopic     Status: Abnormal   Collection Time: 01/07/18  8:31 AM  Result Value Ref Range   Color, Urine YELLOW (A)  YELLOW   APPearance CLEAR (A) CLEAR   Specific Gravity, Urine 1.012 1.005 - 1.030   pH 6.0 5.0 - 8.0   Glucose, UA NEGATIVE NEGATIVE mg/dL   Hgb urine dipstick LARGE (A) NEGATIVE   Bilirubin Urine NEGATIVE NEGATIVE   Ketones, ur 20 (A) NEGATIVE mg/dL   Protein, ur NEGATIVE NEGATIVE mg/dL   Nitrite NEGATIVE NEGATIVE   Leukocytes, UA NEGATIVE NEGATIVE   RBC / HPF TOO NUMEROUS TO COUNT 0 - 5 RBC/hpf   WBC, UA 0-5 0 - 5 WBC/hpf   Bacteria, UA NONE SEEN NONE SEEN   Squamous Epithelial / LPF 0-5 (A) NONE SEEN   Mucus PRESENT     Comment: Performed at [facility], [address] [place], [place] [address]  Pregnancy, urine     Status: None   Collection Time: 01/07/18  8:31 AM  Result Value Ref Range   Preg Test, Ur NEGATIVE NEGATIVE    Comment: Performed at [facility], [address] [place], [place] [address]  Pregnancy, urine POC      Status: None   Collection Time: 01/07/18  8:43 AM  Result Value Ref Range   Preg Test, Ur NEGATIVE NEGATIVE    Comment:        THE SENSITIVITY OF THIS METHODOLOGY IS >24 mIU/mL   Chlamydia/NGC rt PCR ([facility] only)     Status: Abnormal   Collection Time: 01/07/18 10:15 AM  Result Value Ref Range   Specimen source GC/Chlam ENDOCERVICAL    Chlamydia Tr NOT DETECTED NOT DETECTED   N gonorrhoeae DETECTED (A) NOT DETECTED    Comment: (NOTE) 100  This methodology has not been evaluated in pregnant women or in 200  patients with a history of hysterectomy. 300 400  This methodology will not be performed on patients less than 72  years of age. Performed at [facility], [address] [place], [place] [address]   Wet prep, genital     Status: Abnormal   Collection Time: 01/07/18 10:15 AM  Result Value Ref Range   Yeast Wet Prep HPF POC NONE SEEN NONE SEEN   Trich, Wet Prep NONE SEEN NONE SEEN   Clue Cells Wet Prep HPF POC NONE SEEN NONE SEEN   WBC, Wet Prep HPF POC FEW (A) NONE SEEN   Sperm NONE SEEN     Comment: Performed at [facility], [address] [place], [place] [address]  HIV antibody (Routine Testing)     Status: None   Collection Time: 01/07/18  3:29 PM  Result Value Ref Range   HIV Screen 4th Generation wRfx Non Reactive Non Reactive    Comment: (NOTE) Performed At: [facility] [place], [place] [id number] Rush Farmer MD [phone] Performed at [facility], [address] [place], [place] [address]   CBC     Status: Abnormal   Collection Time: 01/08/18  6:34 AM  Result Value Ref Range   WBC 5.8 3.6 - 11.0 K/uL   RBC 3.63 (L) 3.80 - 5.20 MIL/uL   Hemoglobin 11.5 (L) 12.0 - 16.0 g/dL   HCT 34.1 (L) 35.0 - 47.0 %   MCV 94.0 80.0 - 100.0 fL   MCH 31.7 26.0 - 34.0 pg   MCHC 33.7 32.0 - 36.0 g/dL   RDW 13.5 11.5 - 14.5 %   Platelets 173 150 - 440 K/uL    Comment: Performed at [facility], [address], [place], [place] [address]  EXAM: TRANSABDOMINAL AND TRANSVAGINAL ULTRASOUND OF PELVIS  TECHNIQUE: Both transabdominal and transvaginal ultrasound examinations of the pelvis were performed. Transabdominal technique was performed for global imaging of the pelvis including uterus, ovaries, adnexal regions, and pelvic cul-de-sac. It was necessary to proceed with endovaginal exam following the transabdominal exam to visualize the endometrium and bilateral ovaries.  COMPARISON:  None  FINDINGS: Uterus  Measurements: 7.9 x 4.6 x 5.4 cm. No fibroids or other mass visualized.  Endometrium  Thickness: 5 mm.  No focal abnormality visualized.  Right ovary  Measurements: 4.6 x 1.9 x 2.7 cm. Mildly dilated tubular structure adjacent to the right ovary, favoring hydrosalpinx.  Left ovary  Measurements: 4.8 x 1.6 x 2.5 cm. Moderately dilated tubular structure adjacent to the left ovary, favoring hydrosalpinx.  Other findings  Trace pelvic fluid.  IMPRESSION: Suspected bilateral hydrosalpinx, left greater than right.  Bilateral ovaries are within normal limits.   Electronically Signed   By: Julian Hy M.D.   On: 01/07/2018 12:09   Discharge Exam: Blood pressure 105/71, pulse 60, temperature 98.8 F (37.1 C), temperature source Oral, resp. rate 20, height 5' 6" (1.676 m), weight 77.1 kg (170 lb), last menstrual period 12/31/2017, SpO2 99 %. General: awake, alert, in NAD Heart: RRR without murmur Abdomen: active bowel sounds, soft, not distended. Mild TTP in left lower quadrant and periumbilically, no guarding Extremities: no evidence of DVT, no edema    Disposition: 01-Home or Self Care. Note to keep out of work through 01/12/2018.  Discharge Instructions    Discharge patient   Complete by:  As directed    After noon Mefoxin dose. DC IV after this dose.   Discharge disposition:  01-Home or Self Care   Discharge patient date:   01/09/2018     Allergies as of 01/09/2018   No Known Allergies     Medication List    TAKE these medications   doxycycline 100 MG tablet Commonly known as:  VIBRA-TABS Take 1 tablet (100 mg total) by mouth 2 (two) times daily.   ibuprofen 600 MG tablet Commonly known as:  ADVIL,MOTRIN Take 1 tablet (600 mg total) by mouth every 6 (six) hours as needed.   oxyCODONE-acetaminophen 5-325 MG tablet Commonly known as:  PERCOCET/ROXICET Take 1-2 tablets by mouth every 6 (six) hours as needed for moderate pain or severe pain.     Activity: No driving or working when taking Percocet. Limited activity the next two days. No heavy lifting.   Follow UP appointment in 2-3 weeks at [facility] for pelvic ultrasound and appointment with Dr Gilman Schmidt  Signed: Dalia Heading 01/09/2018, 10:49 AM

## 2018-01-09 NOTE — Progress Notes (Signed)
Progress Note  PAD #2 for PID/ +gonorrhea PCR./bilateral hydrosalpinyx   S: Feeling better, Percocet controlling pain. Tolerating regular diet. Wants to go home  O: BP 105/71 (BP Location: Left Arm)   Pulse 60   Temp 98.8 F (37.1 C) (Oral)   Resp 20   Ht 5\' 6"  (1.676 m)   Wt 77.1 kg (170 lb)   LMP 12/31/2017 (Exact Date)   SpO2 99%   BMI 27.44 kg/m   Last temp 0800 on 12/2013: 110.1, afebrile since then Receiving Mefoxin 2 GM q6 hours IV. Will be on Mefoxin 48 hours as of noon dose. Doxycycline given 1/15, but none since.  General: awake, alert, in NAD Heart: RRR without murmur Abdomen: active bowel sounds, soft, not distended. Mild TTP in left lower quadrant and periumbilically, no guarding Extremities: no evidence of DVT, no edema  WBC normal at 5.8K yesterday, down from 13.1K on admission  A: PID, improving  P: DC home this afternoon after noon dose of Mefoxin.  Have already restarted doxycycline-will need to continue x 14 days as OP Will prescribe a few Percocet and some Motrin for pain Work excuse through the weekend. Advised can not drive or work while taking Percocet. Discussed POM with Dr Starr SinclairJackson  Coreon Simkins, CNM

## 2018-01-20 ENCOUNTER — Other Ambulatory Visit: Payer: Self-pay | Admitting: Obstetrics and Gynecology

## 2018-01-20 DIAGNOSIS — N730 Acute parametritis and pelvic cellulitis: Secondary | ICD-10-CM

## 2018-01-23 ENCOUNTER — Other Ambulatory Visit: Payer: Self-pay

## 2018-01-23 ENCOUNTER — Ambulatory Visit (INDEPENDENT_AMBULATORY_CARE_PROVIDER_SITE_OTHER): Payer: Medicaid Other | Admitting: Obstetrics and Gynecology

## 2018-01-23 ENCOUNTER — Ambulatory Visit: Payer: Self-pay | Admitting: Obstetrics and Gynecology

## 2018-01-23 ENCOUNTER — Encounter: Payer: Self-pay | Admitting: Obstetrics and Gynecology

## 2018-01-23 ENCOUNTER — Ambulatory Visit (INDEPENDENT_AMBULATORY_CARE_PROVIDER_SITE_OTHER): Payer: Medicaid Other

## 2018-01-23 VITALS — BP 110/70 | HR 70 | Ht 67.0 in | Wt 170.0 lb

## 2018-01-23 DIAGNOSIS — N7093 Salpingitis and oophoritis, unspecified: Secondary | ICD-10-CM | POA: Diagnosis not present

## 2018-01-23 DIAGNOSIS — B3731 Acute candidiasis of vulva and vagina: Secondary | ICD-10-CM

## 2018-01-23 DIAGNOSIS — N730 Acute parametritis and pelvic cellulitis: Secondary | ICD-10-CM

## 2018-01-23 DIAGNOSIS — A549 Gonococcal infection, unspecified: Secondary | ICD-10-CM

## 2018-01-23 DIAGNOSIS — B373 Candidiasis of vulva and vagina: Secondary | ICD-10-CM | POA: Diagnosis not present

## 2018-01-23 MED ORDER — AZITHROMYCIN 500 MG PO TABS: 1000.0000 mg | ORAL_TABLET | Freq: Once | ORAL | 0 refills | Status: AC

## 2018-01-23 MED ORDER — CEFTRIAXONE SODIUM 250 MG IJ SOLR
250.0000 mg | Freq: Once | INTRAMUSCULAR | Status: AC
  Administered 2018-01-23: 250 mg via INTRAMUSCULAR

## 2018-01-23 MED ORDER — CEFTRIAXONE SODIUM 500 MG IJ SOLR: 500.0000 mg | Freq: Once | INTRAMUSCULAR | Status: DC

## 2018-01-23 MED ORDER — FLUCONAZOLE 150 MG PO TABS: 150.0000 mg | ORAL_TABLET | Freq: Once | ORAL | 0 refills | Status: AC

## 2018-01-23 NOTE — Patient Instructions (Signed)
Gonorrhea Gonorrhea is a sexually transmitted disease (STD) that can affect both men and women. If left untreated, this infection can:  Damage the female or female organs.  Cause women and men to be unable to have children (be sterile).  Harm a fetus if an infected woman is pregnant.  It is important to get treatment for gonorrhea as soon as possible. It is also necessary for all of your sexual partners to be tested for the infection. What are the causes? This condition is caused by bacteria called Neisseria gonorrhoeae. The infection is spread from person to person through sexual contact, including oral, anal, and vaginal sex. A newborn can contract the infection from his or her mother during birth. What increases the risk? The following factors may make you more likely to develop this condition:  Being a woman who is younger than 27 years of age and who is sexually active.  Being a woman 25 years of age or older who has: ? A new sex partner. ? More than one sex partner. ? A sex partner who has an STD.  Being a man who has: ? A new sex partner. ? More than one sex partner. ? A sex partner who has an STD.  Using condoms inconsistently.  Currently having, or having previously had, an STD.  Exchanging sex for money or drugs.  What are the signs or symptoms? Some people do not have any symptoms. If you do have symptoms, they may be different for females and males. For females  Pain in the lower abdomen.  Abnormal vaginal discharge. The discharge may be cloudy, thick, or yellow-green in color.  Bleeding between periods.  Painful sex.  Burning or itching in and around the vagina.  Pain or burning when urinating.  Irritation, pain, bleeding, or discharge from the rectum. This may occur if the infection was spread by anal sex.  Sore throat or swollen lymph nodes in the neck. This may occur if the infection was spread by oral sex. For males  Abnormal discharge from the  penis. This discharge may be cloudy, thick, or yellow-green in color.  Pain or burning during urination.  Pain or swelling in the testicles.  Irritation, pain, bleeding, or discharge from the rectum. This may occur if the infection was spread by anal sex.  Sore throat, fever, or swollen lymph nodes in the neck. This may occur if the infection was spread by oral sex. How is this diagnosed? This condition is diagnosed based on:  A physical exam.  A sample of discharge that is examined under a microscope to look for the bacteria. The discharge may be taken from the urethra, cervix, throat, or rectum.  Urine tests.  Not all of test results will be available during your visit. How is this treated? This condition is treated with antibiotic medicines. It is important for treatment to begin as soon as possible. Early treatment may prevent some problems from developing. Do not have sex during treatment. Avoid all types of sexual activity for 7 days after treatment is complete and until any sex partners have been treated. Follow these instructions at home:  Take over-the-counter and prescription medicines only as told by your health care provider.  Take your antibiotic medicine as told by your health care provider. Do not stop taking the antibiotic even if you start to feel better.  Do not have sex until at least 7 days after you and your partner(s) have finished treatment and your health care provider says   it is okay.  It is your responsibility to get your test results. Ask your health care provider, or the department performing the test, when your results will be ready.  If you test positive for gonorrhea, inform your recent sexual partners. This includes any oral, anal, or vaginal sex partners. They need to be checked for gonorrhea even if they do not have symptoms. They may need treatment, even if they test negative for gonorrhea.  Keep all follow-up visits as told by your health care  provider. This is important. How is this prevented?  Use latex condoms correctly every time you have sexual intercourse.  Ask if your sexual partner has been tested for STDs and had negative results.  Avoid having multiple sexual partners. Contact a health care provider if:  You develop a bad reaction to the medicine you were prescribed. This may include: ? A rash. ? Nausea. ? Vomiting. ? Diarrhea.  Your symptoms do not get better after a few days of taking antibiotics.  Your symptoms get worse.  You develop new symptoms.  Your pain gets worse.  You have a fever.  You develop pain, itching, or discharge around the eyes. Get help right away if:  You feel dizzy or faint.  You have trouble breathing or have shortness of breath.  You develop an irregular heartbeat.  You have severe abdominal pain with or without shoulder pain.  You develop any bumps or sores (lesions) on your skin.  You develop warmth, redness, pain, or swelling around your joints, such as the knee. Summary  Gonorrhea is an STDthat can affect both men and women.  This condition is caused by bacteria called Neisseria gonorrhoeae. The infection is spread from person to person, usually through sexual contact, including oral, anal, and vaginal sex.  Symptoms vary between males and females. Generally, they include abnormal discharge and burning during urination. Women may also experience painful sex, itching around the vagina, and bleeding between menstrual periods. Men may also experience swelling of the testicles.  This condition is treated with antibiotic medicines. Do not have sex until at least 7 days after completing antibiotic treatment.  If left untreated, gonorrhea can have serious side effects and complications. This information is not intended to replace advice given to you by your health care provider. Make sure you discuss any questions you have with your health care provider. Document  Released: 12/07/2000 Document Revised: 11/09/2016 Document Reviewed: 11/09/2016 Elsevier Interactive Patient Education  2018 [employer] Inc[employer]  

## 2018-01-23 NOTE — Progress Notes (Signed)
HPI:      Ms. Olen Pelshley O Maclay is a 27 y.o. G1P1001 who LMP was Patient's last menstrual period was 12/31/2017 (exact date)., presents today for a problem visit.  She complains of vulvar and vagina itching and irritation. Yesterday she again had unprotected intercourse with her sexual partner who has not been treated yet for gonorrhea. So she is likely reinfected. She reports that she has finished her course of antibiotics. Follow up US today does not show evidence of tuboovarian abscess.   PMHx: She  has a past medical history of Bipolar 1 disorder (HCC). Also,  has no past surgical history on file., family history includes Breast cancer (age of onset: 1750) in her other; Lupus in her mother.,  reports that she has been smoking cigarettes.  She has smoked for the past 0.30 years. she has never used smokeless tobacco. She reports that she drinks alcohol.  She has a current medication list which includes the following prescription(s): azithromycin, doxycycline, fluconazole, ibuprofen, and oxycodone-acetaminophen. Also, has No Known Allergies.  Review of Systems  Constitutional: Negative for chills, fever, malaise/fatigue and weight loss.  HENT: Negative for congestion, hearing loss and sinus pain.   Eyes: Negative for blurred vision and double vision.  Respiratory: Negative for cough, sputum production, shortness of breath and wheezing.   Cardiovascular: Negative for chest pain, palpitations, orthopnea and leg swelling.  Gastrointestinal: Negative for abdominal pain, constipation, diarrhea, nausea and vomiting.  Genitourinary: Negative for dysuria, flank pain, frequency, hematuria and urgency.  Musculoskeletal: Negative for back pain, falls and joint pain.  Skin: Negative for itching and rash.  Neurological: Negative for dizziness and headaches.  Psychiatric/Behavioral: Negative for depression, substance abuse and suicidal ideas. The patient is not nervous/anxious.     Objective: BP 110/70    Pulse 70   Ht 5\' 7"  (1.702 m)   Wt 170 lb (77.1 kg)   LMP 12/31/2017 (Exact Date)   BMI 26.63 kg/m  Physical Exam  Constitutional: She is oriented to person, place, and time. She appears well-developed.  Genitourinary: Uterus normal. There is no lesion on the right labia. There is no lesion on the left labia. Vagina exhibits no lesion. Right adnexum does not display mass. Left adnexum does not display mass.  Cervix exhibits discharge. Cervix does not exhibit motion tenderness.  Genitourinary Comments: Thick white discharge present.  HENT:  Head: Normocephalic and atraumatic.  Eyes: EOM are normal.  Neck: Neck supple. No thyromegaly present.  Cardiovascular: Normal rate, regular rhythm and normal heart sounds.  Pulmonary/Chest: Effort normal and breath sounds normal. Right breast exhibits no inverted nipple, no mass, no nipple discharge and no skin change. Left breast exhibits no inverted nipple, no mass, no nipple discharge and no skin change.  Abdominal: Soft. Bowel sounds are normal. She exhibits no distension and no mass.  Neurological: She is alert and oriented to person, place, and time.  Skin: Skin is warm and dry.  Psychiatric: She has a normal mood and affect. Her behavior is normal. Judgment and thought content normal.  Vitals reviewed. Microscopic wet-mount exam shows normal epithelial cells and hyphae.   ASSESSMENT/PLAN:    Problem List Items Addressed This Visit    None    Visit Diagnoses    Gonorrhea    -  Primary   Relevant Medications   cefTRIAXone (ROCEPHIN) injection 250 mg (Completed)   azithromycin (ZITHROMAX) 500 MG tablet   fluconazole (DIFLUCAN) 150 MG tablet   Tubo-ovarian abscess  Yeast vaginitis       Relevant Medications   cefTRIAXone (ROCEPHIN) injection 250 mg (Completed)   azithromycin (ZITHROMAX) 500 MG tablet   fluconazole (DIFLUCAN) 150 MG tablet     Will retreat for gonorrhea repeat exposure yesterday. Will treat for yeast  infection Will have patient return in 4 weeks, test of cure at that time for gonorrhea. Over 15 minutes were spent face to face with the patient providing counseling  Adelene Idler MD [facility] 01/23/18 6:36 PM

## 2018-02-24 ENCOUNTER — Ambulatory Visit: Payer: Medicaid Other | Admitting: Obstetrics and Gynecology

## 2018-12-21 IMAGING — US US TRANSVAGINAL NON-OB
1 series · 14 of 25 positions shown · non-contrast
Comparison: None

CLINICAL DATA: Severe pelvic pain x2 days, cervical motion
tenderness, evaluate for tubo-ovarian abscess

EXAM:
TRANSABDOMINAL AND TRANSVAGINAL ULTRASOUND OF PELVIS
TECHNIQUE: Both transabdominal and transvaginal ultrasound examinations of the
pelvis were performed. Transabdominal technique was performed for
global imaging of the pelvis including uterus, ovaries, adnexal
regions, and pelvic cul-de-sac. It was necessary to proceed with
endovaginal exam following the transabdominal exam to visualize the
endometrium and bilateral ovaries.

[Series 1: us transvaginal non-ob · 0.26mm/px · 14 of 94 slices shown]
[im 1/94]
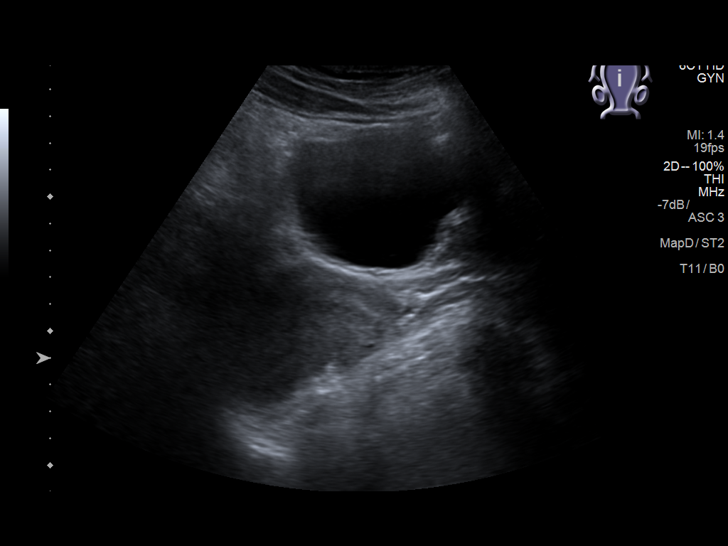
[im 8/94]
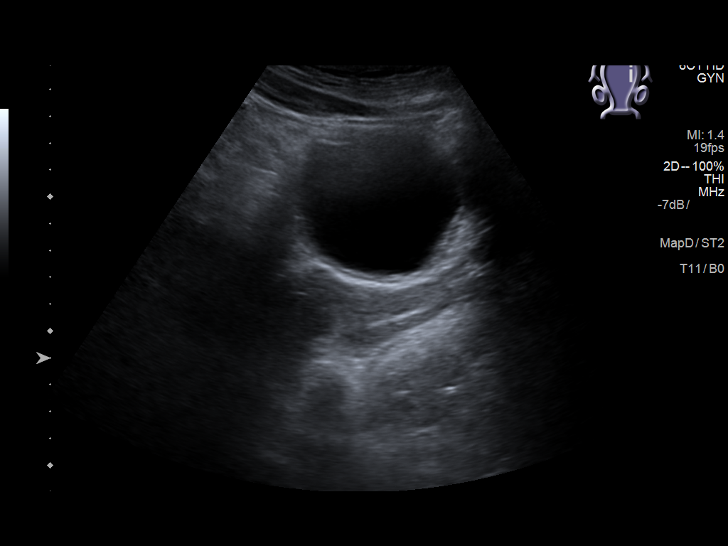
[im 16/94]
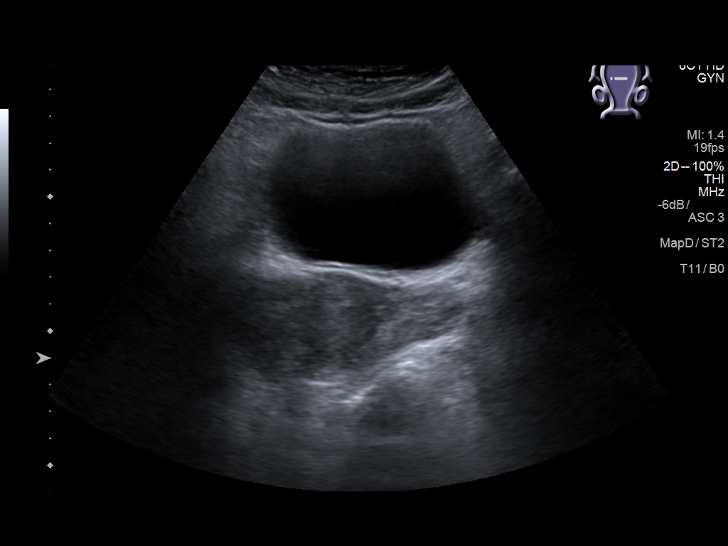
[im 24/94]
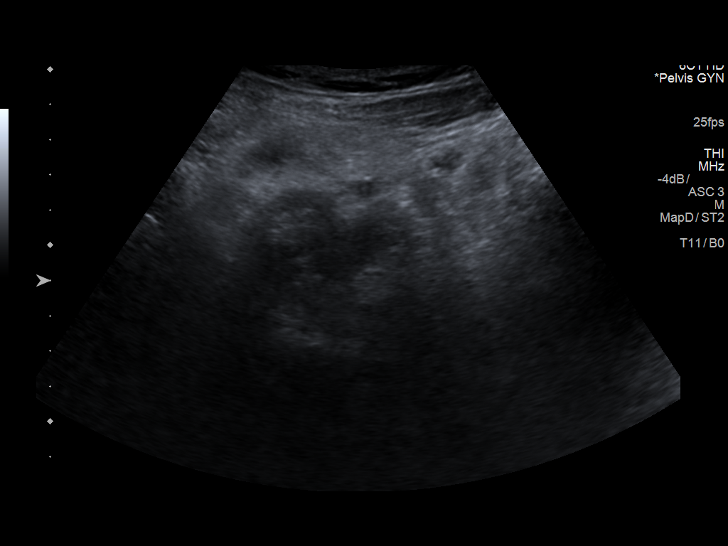
[im 32/94]
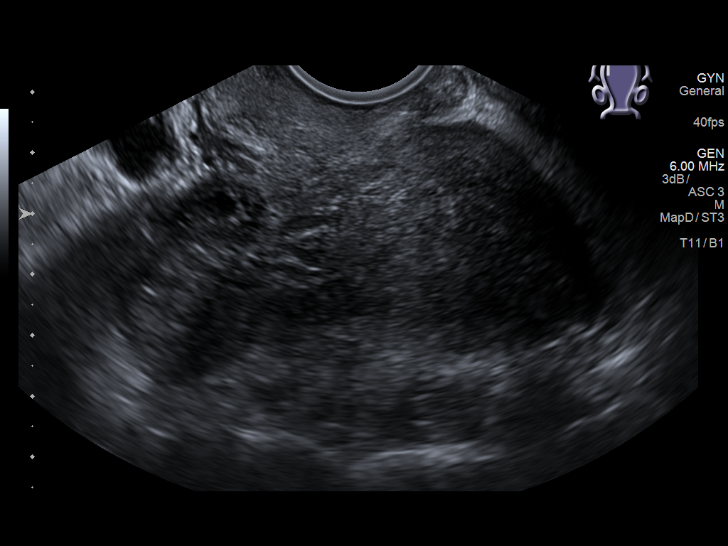
[im 35/94]
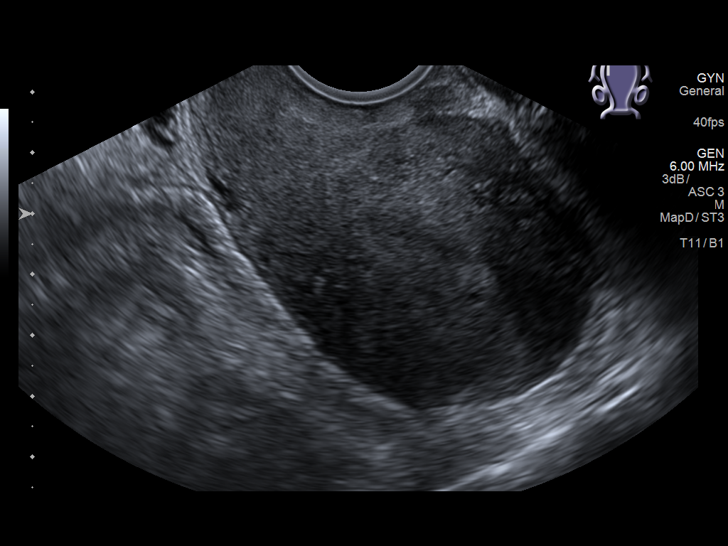
[im 43/94]
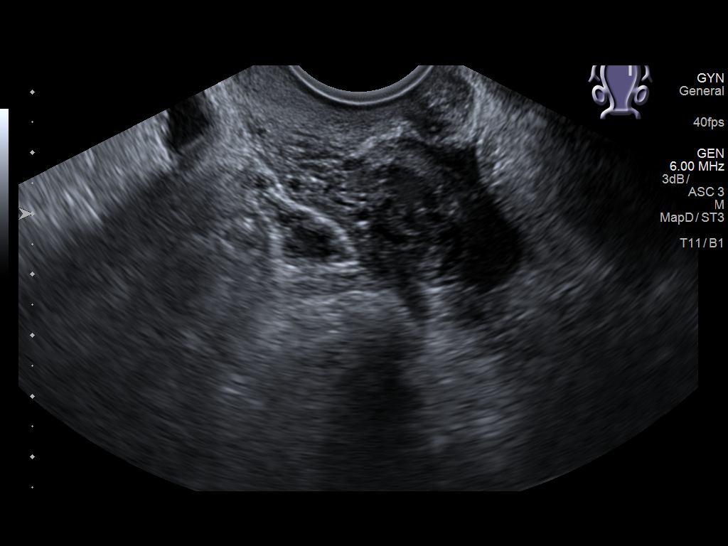
[im 51/94]
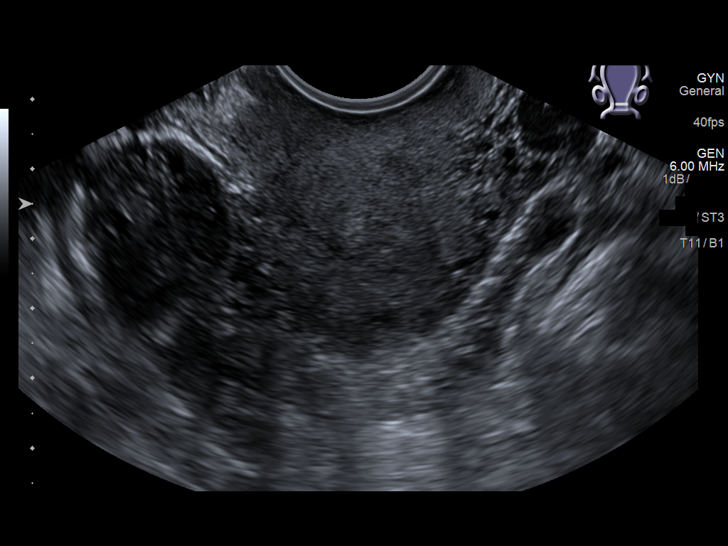
[im 59/94]
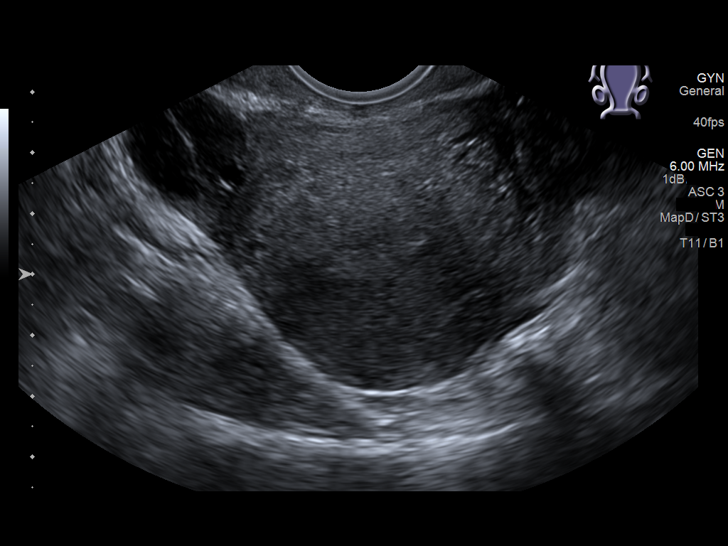
[im 63/94]
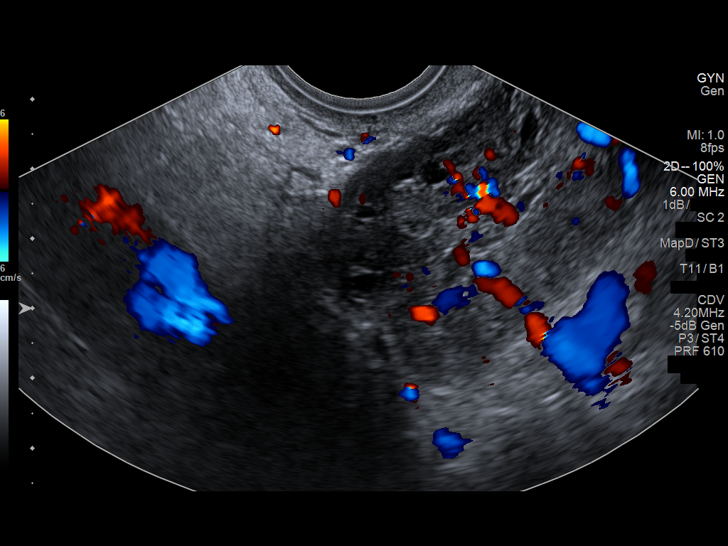
[im 70/94]
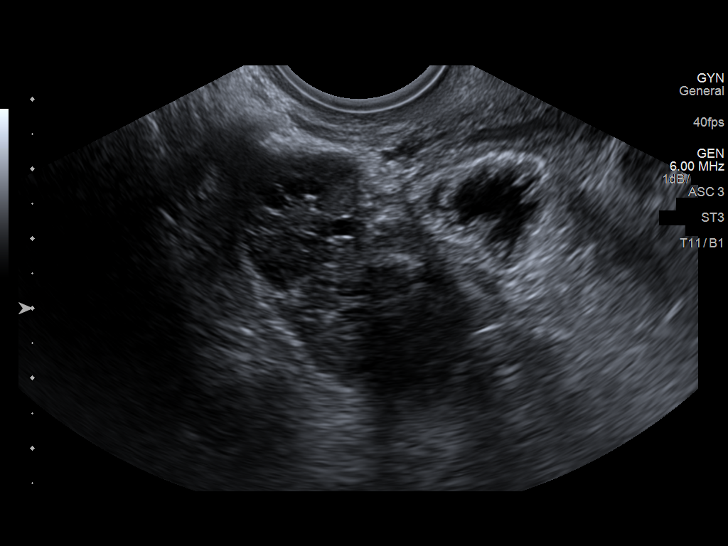
[im 78/94]
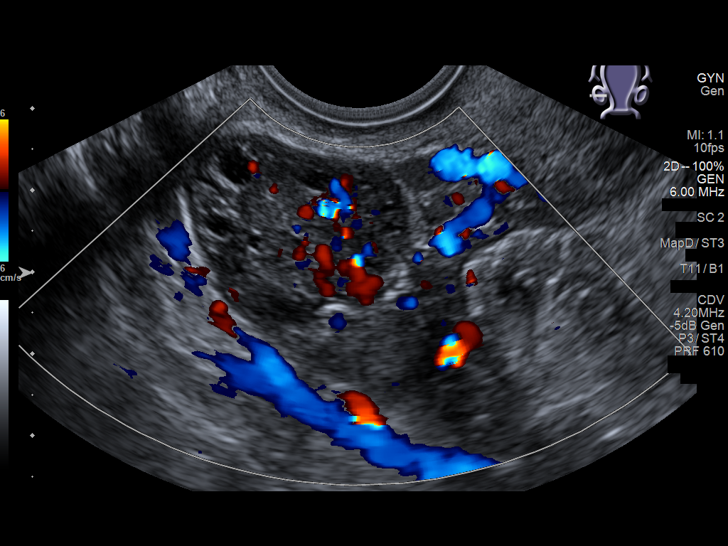
[im 86/94]
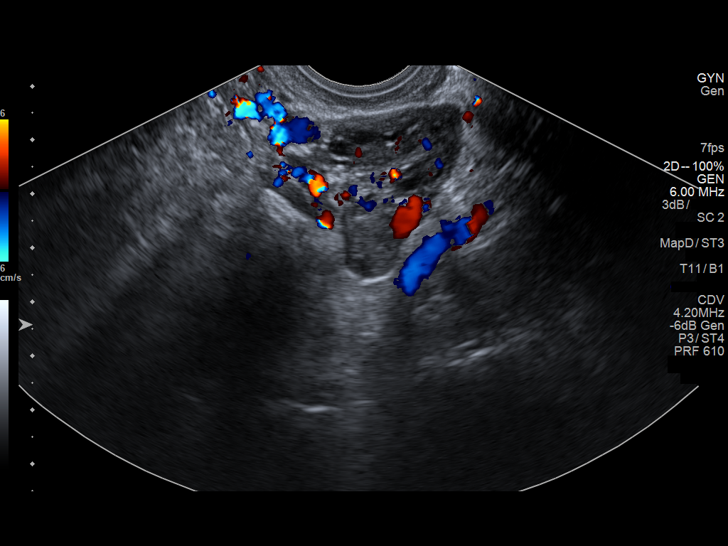
[im 94/94]
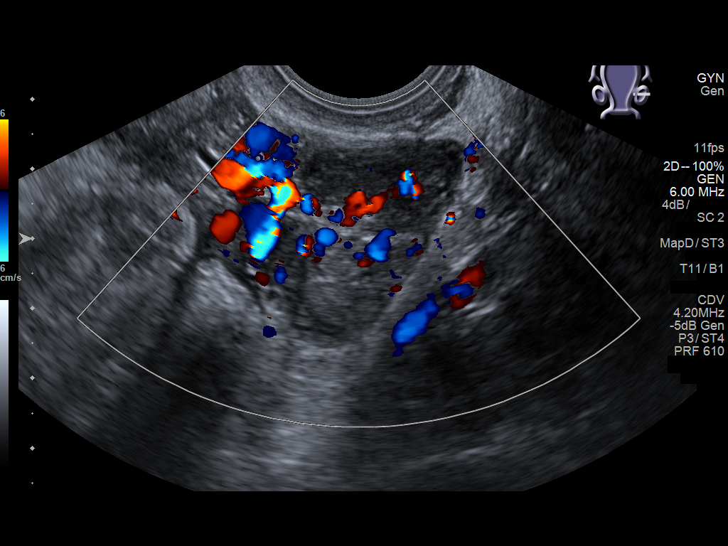

[14 of 25 positions shown; findings below may reference images not displayed]

FINDINGS: Uterus

Measurements: 7.9 x 4.6 x 5.4 cm. No fibroids or other mass
visualized.

Endometrium

Thickness: 5 mm.  No focal abnormality visualized.

Right ovary

Measurements: 4.6 x 1.9 x 2.7 cm. Mildly dilated tubular structure
adjacent to the right ovary, favoring hydrosalpinx.

Left ovary

Measurements: 4.8 x 1.6 x 2.5 cm. Moderately dilated tubular
structure adjacent to the left ovary, favoring hydrosalpinx.

Other findings

Trace pelvic fluid.
IMPRESSION: Suspected bilateral hydrosalpinx, left greater than right.

Bilateral ovaries are within normal limits.

## 2023-07-08 ENCOUNTER — Ambulatory Visit: Payer: Self-pay

## 2023-07-08 ENCOUNTER — Ambulatory Visit
Admission: RE | Admit: 2023-07-08 | Discharge: 2023-07-08 | Disposition: A | Discharge status: Home / Self Care | Payer: BC Managed Care – PPO | Source: Ambulatory Visit

## 2023-07-08 VITALS — BP 121/76 | HR 63 | Temp 99.2°F

## 2023-07-08 DIAGNOSIS — S39012A Strain of muscle, fascia and tendon of lower back, initial encounter: Secondary | ICD-10-CM

## 2023-07-08 DIAGNOSIS — M545 Low back pain, unspecified: Secondary | ICD-10-CM

## 2023-07-08 LAB — URINALYSIS, ROUTINE W REFLEX MICROSCOPIC
Bilirubin Urine: NEGATIVE
Glucose, UA: NEGATIVE mg/dL
Hgb urine dipstick: NEGATIVE
Ketones, ur: NEGATIVE mg/dL
Leukocytes,Ua: NEGATIVE
Nitrite: NEGATIVE
Protein, ur: NEGATIVE mg/dL
Specific Gravity, Urine: 1.025 (ref 1.005–1.030)
pH: 6 (ref 5.0–8.0)

## 2023-07-08 MED ORDER — METHOCARBAMOL 750 MG PO TABS: 750.0000 mg | ORAL_TABLET | Freq: Three times a day (TID) | ORAL | 0 refills | Status: DC | PRN

## 2023-07-08 MED ORDER — NAPROXEN 500 MG PO TABS: 500.0000 mg | ORAL_TABLET | Freq: Two times a day (BID) | ORAL | 0 refills | Status: DC

## 2023-07-08 NOTE — ED Provider Notes (Signed)
[facility]    CSN: [id number] Arrival date & time: 07/08/23  1627      History   Chief Complaint Chief Complaint  Patient presents with   Back Pain    HPI [name] is a 32 y.o. female presenting for bilateral lower back pain x 2 days.  Patient says that she was sitting down and when she stood up she felt the pain.  Denies any injury.  Denies any pain radiating to abdomen or legs.  No numbness, weakness or tingling.  Pain is worse with standing and walking.  Patient states she cannot lean forward or backward without discomfort.  She has tried ibuprofen and Tylenol with minimal improvement in her pain.  She denies ever having pain like this before.  She would like to have her urine checked for possible UTI but denies any dysuria, frequency, urgency, vaginal discharge.  No history of kidney stones reported.  No other complaints.  HPI  Past Medical History:  Diagnosis Date   Bipolar 1 disorder [facility])     Patient Active Problem List   Diagnosis Date Noted   PID (acute pelvic inflammatory disease) 01/07/2018   Tobacco use disorder 05/01/2016   Major depressive disorder, recurrent episode, moderate (HCC) 05/01/2016   Cannabis use disorder, severe, dependence (HCC) 05/01/2016    History reviewed. No pertinent surgical history.  OB History     Gravida  1   Para  1   Term  1   Preterm      AB      Living  1      SAB      IAB      Ectopic      Multiple      Live Births  1            Home Medications    Prior to Admission medications   Medication Sig Start Date End Date Taking? Authorizing Provider  methocarbamol (ROBAXIN) 750 MG tablet Take 1 tablet (750 mg total) by mouth every 8 (eight) hours as needed for muscle spasms. 07/08/23  Yes Eusebio Friendly B, PA-C  naproxen (NAPROSYN) 500 MG tablet Take 1 tablet (500 mg total) by mouth 2 (two) times daily. 07/08/23  Yes Eusebio Friendly B, PA-C  QUEtiapine (SEROQUEL) 50 MG tablet Take by mouth.  05/31/23 05/30/24 Yes [provider]    Family History Family History  Problem Relation Age of Onset   Lupus Mother    Breast cancer Other 81    Social History Social History   Tobacco Use   Smoking status: Every Day    Types: Cigarettes   Smokeless tobacco: Never  Vaping Use   Vaping status: Never Used  Substance Use Topics   Alcohol use: Yes     Allergies   Patient has no known allergies.   Review of Systems Review of Systems  Gastrointestinal:  Negative for abdominal pain, nausea and vomiting.  Genitourinary:  Negative for difficulty urinating, dysuria, flank pain, frequency, hematuria and pelvic pain.  Musculoskeletal:  Positive for back pain. Negative for gait problem.  Neurological:  Negative for weakness and numbness.     Physical Exam Triage Vital Signs ED Triage Vitals  Encounter Vitals Group     BP      Systolic BP Percentile      Diastolic BP Percentile      Pulse      Resp      Temp      Temp  src      SpO2      Weight      Height      Head Circumference      Peak Flow      Pain Score      Pain Loc      Pain Education      Exclude from Growth Chart    No data found.  Updated Vital Signs BP 121/76   Pulse 63   Temp 99.2 F (37.3 C) (Oral)   LMP 07/03/2023 (Exact Date)   SpO2 98%     Physical Exam Vitals and nursing note reviewed.  Constitutional:      General: She is not in acute distress.    Appearance: Normal appearance. She is not ill-appearing or toxic-appearing.  HENT:     Head: Normocephalic and atraumatic.  Eyes:     General: No scleral icterus.       Right eye: No discharge.        Left eye: No discharge.     Conjunctiva/sclera: Conjunctivae normal.  Cardiovascular:     Rate and Rhythm: Normal rate and regular rhythm.     Heart sounds: Normal heart sounds.  Pulmonary:     Effort: Pulmonary effort is normal. No respiratory distress.     Breath sounds: Normal breath sounds.  Abdominal:     Palpations:  Abdomen is soft.     Tenderness: There is no abdominal tenderness. There is no right CVA tenderness or left CVA tenderness.  Musculoskeletal:     Cervical back: Neck supple.     Lumbar back: Tenderness (bilateral paralumbar muscles) present. No bony tenderness. Decreased range of motion. Negative right straight leg raise test and negative left straight leg raise test.  Skin:    General: Skin is dry.  Neurological:     General: No focal deficit present.     Mental Status: She is alert. Mental status is at baseline.     Motor: No weakness.     Gait: Gait normal.  Psychiatric:        Mood and Affect: Mood normal.        Behavior: Behavior normal.        Thought Content: Thought content normal.      UC Treatments / Results  Labs (all labs ordered are listed, but only abnormal results are displayed) Labs Reviewed  URINALYSIS, ROUTINE W REFLEX MICROSCOPIC    EKG   Radiology No results found.  Procedures Procedures (including critical care time)  Medications Ordered in UC Medications - No data to display  Initial Impression / Assessment and Plan / UC Course  I have reviewed the triage vital signs and the nursing notes.  Pertinent labs & imaging results that were available during my care of the patient were reviewed by me and considered in my medical decision making (see chart for details).   32 year old female presents for atraumatic lower back pain x 2 days.  Patient started when she stood up from a sitting position.  Pain is worse with walking, position changes.  No numbness, weakness or tingling.  No fever, abdominal pain, flank pain, urinary symptoms.  Request a urine test.  No improvement with OTC meds.  Vitals are stable.  She is overall well-appearing.  Reduced range of motion of back.  Tenderness of bilateral paralumbar muscles.  No spinal tenderness.  Negative straight leg raise.  Urinalysis obtained at patient request to assess for possible UTI.  UA normal.   Discussed  result with patient.  Lumbar strain.  Will treat at this time with naproxen and Robaxin.  Reviewed RICE guidelines and supportive care.  Reviewed return and ED precautions.   Final Clinical Impressions(s) / UC Diagnoses   Final diagnoses:  Acute bilateral low back pain without sciatica  Strain of lumbar region, initial encounter     Discharge Instructions      BACK PAIN: Stressed avoiding painful activities . RICE (REST, ICE, COMPRESSION, ELEVATION) guidelines reviewed. May alternate ice and heat. Consider use of muscle rubs, Salonpas patches, etc. Use medications as directed including muscle relaxers if prescribed. Take anti-inflammatory medications as prescribed or OTC NSAIDs/Tylenol.  F/u with PCP in 7-10 days for reexamination, and please feel free to call or return to the urgent care at any time for any questions or concerns you may have and we will be happy to help you!   BACK PAIN RED FLAGS: If the back pain acutely worsens or there are any red flag symptoms such as numbness/tingling, leg weakness, saddle anesthesia, or loss of bowel/bladder control, go immediately to the ER. Follow up with [place] as scheduled or sooner if the pain does not begin to resolve or if it worsens before the follow up       ED Prescriptions     Medication Sig Dispense Auth. Provider   naproxen (NAPROSYN) 500 MG tablet Take 1 tablet (500 mg total) by mouth 2 (two) times daily. 30 tablet Eusebio Friendly B, PA-C   methocarbamol (ROBAXIN) 750 MG tablet Take 1 tablet (750 mg total) by mouth every 8 (eight) hours as needed for muscle spasms. 20 tablet Gareth Morgan      PDMP not reviewed this encounter.   Shirlee Latch, PA-C 07/08/23 1755

## 2023-07-08 NOTE — ED Triage Notes (Signed)
Pt presents to UC c/o lower back pain x a while, pt states when she got up Saturday felt like lower back locked up. Pain when walking since then.

## 2023-07-08 NOTE — Discharge Instructions (Signed)
BACK PAIN: Stressed avoiding painful activities . RICE (REST, ICE, COMPRESSION, ELEVATION) guidelines reviewed. May alternate ice and heat. Consider use of muscle rubs, Salonpas patches, etc. Use medications as directed including muscle relaxers if prescribed. Take anti-inflammatory medications as prescribed or OTC NSAIDs/Tylenol.  F/u with PCP in 7-10 days for reexamination, and please feel free to call or return to the urgent care at any time for any questions or concerns you may have and we will be happy to help you!   BACK PAIN RED FLAGS: If the back pain acutely worsens or there are any red flag symptoms such as numbness/tingling, leg weakness, saddle anesthesia, or loss of bowel/bladder control, go immediately to the ER. Follow up with us as scheduled or sooner if the pain does not begin to resolve or if it worsens before the follow up   

## 2024-08-19 ENCOUNTER — Ambulatory Visit: Payer: Self-pay

## 2024-09-15 ENCOUNTER — Ambulatory Visit: Admission: EM | Admit: 2024-09-15 | Discharge: 2024-09-15 | Disposition: A | Discharge status: Home / Self Care

## 2024-09-15 DIAGNOSIS — H6122 Impacted cerumen, left ear: Secondary | ICD-10-CM | POA: Diagnosis not present

## 2024-09-15 NOTE — ED Provider Notes (Signed)
 [facility]    CSN: [id number] Arrival date & time: 09/15/24  1528      History   Chief Complaint Chief Complaint  Patient presents with   Ear Fullness    HPI [name] is a 33 y.o. female presenting for right ear fullness and hearing loss x 3 days. She tried to clean the ear out with a q-tip but was unsuccessful. No ear drainage or bleeding.  HPI  Past Medical History:  Diagnosis Date   Bipolar 1 disorder [facility])     Patient Active Problem List   Diagnosis Date Noted   PID (acute pelvic inflammatory disease) 01/07/2018   Tobacco use disorder 05/01/2016   Major depressive disorder, recurrent episode, moderate (HCC) 05/01/2016   Cannabis use disorder, severe, dependence (HCC) 05/01/2016    History reviewed. No pertinent surgical history.  OB History     Gravida  1   Para  1   Term  1   Preterm      AB      Living  1      SAB      IAB      Ectopic      Multiple      Live Births  1            Home Medications    Prior to Admission medications   Medication Sig Start Date End Date Taking? Authorizing Provider  traZODone (DESYREL) 50 MG tablet Take 50 mg by mouth at bedtime as needed. 08/27/24  Yes [provider]  buPROPion (WELLBUTRIN XL) 150 MG 24 hr tablet Take 150 mg by mouth daily.    [provider]  FLUoxetine  (PROZAC ) 20 MG capsule Take 20 mg by mouth daily.    [provider]  QUEtiapine (SEROQUEL) 50 MG tablet Take by mouth. 05/31/23 05/30/24  [provider]  VIENVA 0.1-20 MG-MCG tablet Take 1 tablet by mouth daily.    [provider]    Family History Family History  Problem Relation Age of Onset   Lupus Mother    Breast cancer Other 58    Social History Social History   Tobacco Use   Smoking status: Every Day    Types: Cigarettes   Smokeless tobacco: Never  Vaping Use   Vaping status: Never Used  Substance Use Topics   Alcohol use: Yes     Allergies    Patient has no known allergies.   Review of Systems Review of Systems  HENT:  Positive for hearing loss. Negative for ear discharge and ear pain.      Physical Exam Triage Vital Signs ED Triage Vitals  Encounter Vitals Group     BP 09/15/24 1636 (!) 133/93     Girls Systolic BP Percentile --      Girls Diastolic BP Percentile --      Boys Systolic BP Percentile --      Boys Diastolic BP Percentile --      Pulse Rate 09/15/24 1636 60     Resp 09/15/24 1636 16     Temp 09/15/24 1636 99.1 F (37.3 C)     Temp Source 09/15/24 1636 Oral     SpO2 09/15/24 1636 98 %     Weight 09/15/24 1635 165 lb (74.8 kg)     Height 09/15/24 1635 5' 6 (1.676 m)     Head Circumference --      Peak Flow --      Pain Score 09/15/24  1641 0     Pain Loc --      Pain Education --      Exclude from Growth Chart --    No data found.  Updated Vital Signs BP (!) 133/93 (BP Location: Right Arm)   Pulse 60   Temp 99.1 F (37.3 C) (Oral)   Resp 16   Ht 5' 6 (1.676 m)   Wt 165 lb (74.8 kg)   LMP 08/09/2024 (Approximate)   SpO2 98%   BMI 26.63 kg/m       Physical Exam Vitals and nursing note reviewed.  Constitutional:      General: She is not in acute distress.    Appearance: Normal appearance. She is not ill-appearing or toxic-appearing.  HENT:     Head: Normocephalic and atraumatic.     Right Ear: External ear normal. There is impacted cerumen.  Eyes:     General: No scleral icterus.       Right eye: No discharge.        Left eye: No discharge.     Conjunctiva/sclera: Conjunctivae normal.  Cardiovascular:     Rate and Rhythm: Normal rate.  Pulmonary:     Effort: Pulmonary effort is normal. No respiratory distress.  Musculoskeletal:     Cervical back: Neck supple.  Skin:    General: Skin is dry.  Neurological:     General: No focal deficit present.     Mental Status: She is alert. Mental status is at baseline.     Motor: No weakness.     Gait: Gait normal.  Psychiatric:         Mood and Affect: Mood normal.        Behavior: Behavior normal.      UC Treatments / Results  Labs (all labs ordered are listed, but only abnormal results are displayed) Labs Reviewed - No data to display  EKG   Radiology No results found.  Procedures Procedures (including critical care time)  Medications Ordered in UC Medications - No data to display  Initial Impression / Assessment and Plan / UC Course  I have reviewed the triage vital signs and the nursing notes.  Pertinent labs & imaging results that were available during my care of the patient were reviewed by me and considered in my medical decision making (see chart for details).   33 y/o female presents for left ear fullness and reduced hearing.   On exam, patient has cerumen impaction of the left EAC. Patient gives consent for otic lavage.   Otic lavage performed by myself.  Earwax was softened and then used hydroperoxide and warm water to flush air.  Also used curette to manually remove cerumen which patient tolerated well.  All cerumen removed.  Patient reported relief of symptoms.  Reviewed return precautions.   Final Clinical Impressions(s) / UC Diagnoses   Final diagnoses:  Hearing loss of left ear due to cerumen impaction     Discharge Instructions      -Can try Debrox over the counter to soften the wax and then you can try flushing it at home with a kit or may return here to try again     ED Prescriptions   None    PDMP not reviewed this encounter.   Arvis Jolan NOVAK, PA-C 09/15/24 1750

## 2024-09-15 NOTE — Discharge Instructions (Signed)
-  Can try Debrox over the counter to soften the wax and then you can try flushing it at home with a kit or may return here to try again

## 2024-09-15 NOTE — ED Triage Notes (Signed)
 Pt c/o R ear fullness x3 days. Denies any pain.
